# Patient Record
Sex: Female | Born: 1935 | Race: White | Hispanic: No | Smoking: Never smoker
Health system: Southern US, Community
[De-identification: ages and names within clinical notes are randomized; demographics above are authoritative.]

## PROBLEM LIST (undated history)

## (undated) ENCOUNTER — Ambulatory Visit: Admission: EM | Source: Home / Self Care

## (undated) DIAGNOSIS — I4891 Unspecified atrial fibrillation: Secondary | ICD-10-CM

## (undated) DIAGNOSIS — H269 Unspecified cataract: Secondary | ICD-10-CM

## (undated) DIAGNOSIS — I639 Cerebral infarction, unspecified: Secondary | ICD-10-CM

## (undated) DIAGNOSIS — J189 Pneumonia, unspecified organism: Secondary | ICD-10-CM

## (undated) DIAGNOSIS — K219 Gastro-esophageal reflux disease without esophagitis: Secondary | ICD-10-CM

## (undated) DIAGNOSIS — M2550 Pain in unspecified joint: Secondary | ICD-10-CM

## (undated) DIAGNOSIS — R0600 Dyspnea, unspecified: Secondary | ICD-10-CM

## (undated) DIAGNOSIS — J449 Chronic obstructive pulmonary disease, unspecified: Secondary | ICD-10-CM

## (undated) DIAGNOSIS — M1990 Unspecified osteoarthritis, unspecified site: Secondary | ICD-10-CM

## (undated) HISTORY — DX: Gastro-esophageal reflux disease without esophagitis: K21.9

## (undated) HISTORY — DX: Cerebral infarction, unspecified: I63.9

## (undated) HISTORY — PX: CATARACT EXTRACTION: SUR2

## (undated) HISTORY — DX: Unspecified atrial fibrillation: I48.91

## (undated) HISTORY — DX: Pain in unspecified joint: M25.50

## (undated) HISTORY — PX: OTHER SURGICAL HISTORY: SHX169

## (undated) HISTORY — DX: Unspecified cataract: H26.9

## (undated) HISTORY — PX: HIP SURGERY: SHX245

## (undated) HISTORY — PX: HYSTERECTOMY ABDOMINAL WITH SALPINGECTOMY: SHX6725

---

## 2009-06-02 ENCOUNTER — Ambulatory Visit: Payer: Self-pay | Admitting: Cardiology

## 2010-04-26 ENCOUNTER — Ambulatory Visit: Payer: Self-pay | Admitting: Gastroenterology

## 2010-04-26 DIAGNOSIS — R109 Unspecified abdominal pain: Secondary | ICD-10-CM | POA: Insufficient documentation

## 2010-04-26 DIAGNOSIS — K219 Gastro-esophageal reflux disease without esophagitis: Secondary | ICD-10-CM | POA: Insufficient documentation

## 2010-04-26 DIAGNOSIS — Z8711 Personal history of peptic ulcer disease: Secondary | ICD-10-CM | POA: Insufficient documentation

## 2010-04-26 DIAGNOSIS — K5289 Other specified noninfective gastroenteritis and colitis: Secondary | ICD-10-CM | POA: Insufficient documentation

## 2010-04-28 ENCOUNTER — Encounter: Payer: Self-pay | Admitting: Internal Medicine

## 2010-05-06 ENCOUNTER — Encounter: Payer: Self-pay | Admitting: Internal Medicine

## 2010-07-29 ENCOUNTER — Encounter (INDEPENDENT_AMBULATORY_CARE_PROVIDER_SITE_OTHER): Payer: Self-pay

## 2010-08-23 NOTE — Assessment & Plan Note (Signed)
Summary: COLITIS/SS   Visit Type:  New patient Primary Care Provider:  Dr. Linna Darner  Chief Complaint:  colitis.  History of Present Illness: Alisha Murphy is here as self-referral for further evaluation of colitis. She reports seven years of "colitis". Intermittent bouts. Initial episode bloody stools. She states she was told she had ulcerative colitis but never was given chronic medication. Last TCS 2/07, Dr. Linna Darner, per D/C summary she had findings of pseudomembranous colitis at that time. Hospitalized in 2003 and reported to have ischemic colitis at that time. Now the "spells" involve terrible abd cramps and solid to watery stools for several hours and goes away. In hospital three weeks ago at [facility], given Cipro/Flagyl. Stool tests done but doesn't know results. CT A/P done, was told the colon was inflammed. Prior to hospitalization had been on Relafen for two weeks. In between episodes, has small bm every time she goes to urinate. ?started after Rectocele surgery? No brbpr, melena. Stools dark. Daily heartburn if misses ranitidine. 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 No dysphagia, n/v. Weight stable.    H/O remote PUD, Dr. Linna Darner did EGD.  Labs 9/11: glucose 147, cre 0.98, h/h 13/39.1. Cdiff negative.  CT A/P without contrast 9/11: left-seded colitis, ischemia vs inflammatory vs infectious.   Current Medications (verified): 1)  Tylenol Arthritis Pain 650 Mg Cr-Tabs (Acetaminophen) .... Once Daily 2)  Metoprolol Tartrate 25 Mg Tabs (Metoprolol Tartrate) .... Once Daily 3)  Lisinopril-Hydrochlorothiazide 20-12.5 Mg Tabs (Lisinopril-Hydrochlorothiazide) .... Once Daily 4)  Alprazolam 0.5 Mg Tabs (Alprazolam) .... 1/2 At Bedtime As Needed 5)  Ranitidine Hcl 150 Mg Caps (Ranitidine Hcl) .... Once Daily  Allergies (verified): 1)  ! * Anitbiotics?  Past History:  Past Medical History: Bronchitis Hypertension Tachycardia Insomnia Colonoscopy, 2007, Dr. Wilmer Floor patient,?incomplete  Past Surgical  History: Rectocele repair, 2005 Bilateral hip replacement Hysterectomy, partial Bladder Repair  Family History: Mother, deceased, stomach cancer No FH of CRC, IBD  Social History: Widowed. One son. No tob, alcohol, drug. Rare wine.  Review of Systems General:  Denies fever, chills, sweats, anorexia, fatigue, weakness, and weight loss. Eyes:  Denies vision loss. ENT:  Denies nasal congestion, sore throat, hoarseness, and difficulty swallowing. CV:  Denies chest pains, angina, palpitations, dyspnea on exertion, and peripheral edema. Resp:  Denies dyspnea at rest, dyspnea with exercise, cough, sputum, and wheezing. GI:  See HPI. GU:  Denies urinary burning and blood in urine. MS:  Denies joint pain / LOM. Derm:  Denies rash and itching. Neuro:  Denies weakness, frequent headaches, memory loss, and confusion. Psych:  Denies depression and anxiety. Endo:  Denies unusual weight change. Heme:  Denies bruising and bleeding. Allergy:  Denies hives and rash.  Vital Signs:  Patient profile:   76 year old female Height:      62.5 inches Weight:      170 pounds BMI:     30.71 Temp:     98.6 degrees F oral Pulse rate:   88 / minute BP sitting:   128 / 80  (left arm) Cuff size:   regular  Vitals Entered By: Hendricks Limes LPN (April 26, 2010 9:01 AM)  Physical Exam  General:  Well developed, well nourished, no acute distress. Head:  Normocephalic and atraumatic. Eyes:  Conjunctivae pink, no scleral icterus.  Mouth:  Oropharyngeal mucosa moist, pink.  No lesions, erythema or exudate.    Neck:  Supple; no masses or thyromegaly. Lungs:  Clear throughout to auscultation. Heart:  Regular rate and rhythm; no murmurs,  rubs,  or bruits. Abdomen:  Bowel sounds normal.  Abdomen is soft, nontender, nondistended.  No rebound or guarding.  No hepatosplenomegaly, masses or hernias.  No abdominal bruits.  Rectal:  deferred until time of colonoscopy.   Extremities:  No clubbing, cyanosis, edema  or deformities noted. Neurologic:  Alert and  oriented x4;  grossly normal neurologically. Skin:  Intact without significant lesions or rashes. Cervical Nodes:  No significant cervical adenopathy. Psych:  Alert and cooperative. Normal mood and affect.  Impression & Recommendations:  Problem # 1:  COLITIS (ICD-558.9)  Intermittent colitis for several years. Records received indicate ischemic colitis in 2003, pseudomembranous colitis in 2007, recent hospitalization for colitis (seen on CT). Patient has frequent stools inbetween episodes of severe abd pain and diarrhea (lasts for hours at a time). Colonoscopy to be performed in near future.  Risks, alternatives, and benefits including but not limited to the risk of reaction to medication, bleeding, infection, and perforation were addressed.  Patient voiced understanding and provided verbal consent.   Orders: New Patient Level IV (04540)  Problem # 2:  GERD (ICD-530.81)  Chronic GERD and recent epigastric pain in setting of Relafen. Patient worried about PUD or gastric ca. FH gastric ca in mother. Remote PUD. Patient on H2 blocker currently. EGD to be performed in near future.  Risks, alternatives, benefits including but not limited to risk of reaction to medications, bleeding, infection, and perforation addressed.  Patient voiced understanding and verbal consent obtained.   Orders: New Patient Level IV (98119)  Appended Document: COLITIS/SS Need TCS report from Dr. Linna Darner, any available from 2003 to present and including bx result.  Appended Document: COLITIS/SS Records are on your desk  Appended Document: COLITIS/SS Attempted TCS 12/09 by Dr. Linna Darner. Limited to ascending colon due to patient discomfort. Received Versed 7mg /Demeraol 50mg . Unremarkable exam otherwise. ACBE showed extensive diverticular change of sigmoid colon.

## 2010-08-23 NOTE — Letter (Signed)
Summary: RECORDS FROM [facility]  RECORDS FROM [facility]   Imported By: Rexene Alberts 04/28/2010 15:54:52  _____________________________________________________________________  External Attachment:    Type:   Image     Comment:   External Document

## 2010-08-23 NOTE — Letter (Signed)
Summary: egd/tcs order  egd/tcs order   Imported By: Ave Filter 05/06/2010 09:57:57  _____________________________________________________________________  External Attachment:    Type:   Image     Comment:   External Document  Appended Document: egd/tcs order Pt called and stated that she did her prep on Sunday..She said she thought she was scheduled for today.So, she ate a big breakfast this morning. I told her I could speak to Dr Jena Gauss about doing an additional prep for todau since she had breakfast this morning and she said she would rather wait to have the procedure. She will call back whe she is ready to do the procedure.  Appended Document: egd/tcs order Let's put a reminder to contact her about procedures if she fails to schedule in near future.  Appended Document: egd/tcs order Reminder placed in idx Jan. 2012.

## 2010-08-25 NOTE — Letter (Signed)
Summary: Recall Colonoscopy/Endoscopy, Change to Office Visit  [facility]  [address]   [place], [place] [address]   Phone: [phone]  Fax: [phone]      July 29, 2010   [facility] [address] [place], [place]  [address] 02/14/1935   Dear Ms. Collantes,   According to our records, it is time for you to schedule a Colonoscopy/Endoscopy. However, after reviewing your medical record, we recommend an office visit.   Please call [phone] at your convenience to schedule an office visit. If you have any questions or concerns, please feel free to contact our office.   Sincerely,   Cloria Spring LPN  [facility] Ph: [phone]   Fax: [phone]

## 2015-08-03 DIAGNOSIS — C4491 Basal cell carcinoma of skin, unspecified: Secondary | ICD-10-CM

## 2015-08-03 HISTORY — DX: Basal cell carcinoma of skin, unspecified: C44.91

## 2015-09-16 DIAGNOSIS — C4441 Basal cell carcinoma of skin of scalp and neck: Secondary | ICD-10-CM | POA: Diagnosis not present

## 2015-10-04 DIAGNOSIS — I10 Essential (primary) hypertension: Secondary | ICD-10-CM | POA: Diagnosis not present

## 2015-10-04 DIAGNOSIS — Z6831 Body mass index (BMI) 31.0-31.9, adult: Secondary | ICD-10-CM | POA: Diagnosis not present

## 2015-10-04 DIAGNOSIS — J440 Chronic obstructive pulmonary disease with acute lower respiratory infection: Secondary | ICD-10-CM | POA: Diagnosis not present

## 2015-10-06 DIAGNOSIS — Z1211 Encounter for screening for malignant neoplasm of colon: Secondary | ICD-10-CM | POA: Diagnosis not present

## 2015-10-07 DIAGNOSIS — Z1231 Encounter for screening mammogram for malignant neoplasm of breast: Secondary | ICD-10-CM | POA: Diagnosis not present

## 2015-10-18 DIAGNOSIS — H524 Presbyopia: Secondary | ICD-10-CM | POA: Diagnosis not present

## 2015-11-26 DIAGNOSIS — S79912A Unspecified injury of left hip, initial encounter: Secondary | ICD-10-CM | POA: Diagnosis not present

## 2015-11-26 DIAGNOSIS — Z8249 Family history of ischemic heart disease and other diseases of the circulatory system: Secondary | ICD-10-CM | POA: Diagnosis not present

## 2015-11-26 DIAGNOSIS — S86911A Strain of unspecified muscle(s) and tendon(s) at lower leg level, right leg, initial encounter: Secondary | ICD-10-CM | POA: Diagnosis not present

## 2015-11-26 DIAGNOSIS — M1990 Unspecified osteoarthritis, unspecified site: Secondary | ICD-10-CM | POA: Diagnosis not present

## 2015-11-26 DIAGNOSIS — M25551 Pain in right hip: Secondary | ICD-10-CM | POA: Diagnosis not present

## 2015-11-26 DIAGNOSIS — S79911A Unspecified injury of right hip, initial encounter: Secondary | ICD-10-CM | POA: Diagnosis not present

## 2015-11-26 DIAGNOSIS — Z96643 Presence of artificial hip joint, bilateral: Secondary | ICD-10-CM | POA: Diagnosis not present

## 2015-11-26 DIAGNOSIS — S76011A Strain of muscle, fascia and tendon of right hip, initial encounter: Secondary | ICD-10-CM | POA: Diagnosis not present

## 2015-11-26 DIAGNOSIS — Z7982 Long term (current) use of aspirin: Secondary | ICD-10-CM | POA: Diagnosis not present

## 2015-11-26 DIAGNOSIS — Z79899 Other long term (current) drug therapy: Secondary | ICD-10-CM | POA: Diagnosis not present

## 2015-11-26 DIAGNOSIS — M25561 Pain in right knee: Secondary | ICD-10-CM | POA: Diagnosis not present

## 2015-11-26 DIAGNOSIS — X501XXA Overexertion from prolonged static or awkward postures, initial encounter: Secondary | ICD-10-CM | POA: Diagnosis not present

## 2015-11-26 DIAGNOSIS — S8991XA Unspecified injury of right lower leg, initial encounter: Secondary | ICD-10-CM | POA: Diagnosis not present

## 2015-11-26 DIAGNOSIS — I10 Essential (primary) hypertension: Secondary | ICD-10-CM | POA: Diagnosis not present

## 2015-11-28 DIAGNOSIS — J9811 Atelectasis: Secondary | ICD-10-CM | POA: Diagnosis not present

## 2015-11-28 DIAGNOSIS — D72829 Elevated white blood cell count, unspecified: Secondary | ICD-10-CM | POA: Diagnosis not present

## 2015-11-28 DIAGNOSIS — L02416 Cutaneous abscess of left lower limb: Secondary | ICD-10-CM | POA: Diagnosis not present

## 2015-11-28 DIAGNOSIS — K449 Diaphragmatic hernia without obstruction or gangrene: Secondary | ICD-10-CM | POA: Diagnosis not present

## 2015-11-28 DIAGNOSIS — B9562 Methicillin resistant Staphylococcus aureus infection as the cause of diseases classified elsewhere: Secondary | ICD-10-CM | POA: Diagnosis not present

## 2015-11-28 DIAGNOSIS — N178 Other acute kidney failure: Secondary | ICD-10-CM | POA: Diagnosis not present

## 2015-11-28 DIAGNOSIS — I517 Cardiomegaly: Secondary | ICD-10-CM | POA: Diagnosis not present

## 2015-11-28 DIAGNOSIS — J811 Chronic pulmonary edema: Secondary | ICD-10-CM | POA: Diagnosis not present

## 2015-11-28 DIAGNOSIS — N179 Acute kidney failure, unspecified: Secondary | ICD-10-CM | POA: Diagnosis not present

## 2015-11-28 DIAGNOSIS — R52 Pain, unspecified: Secondary | ICD-10-CM | POA: Diagnosis not present

## 2015-11-28 DIAGNOSIS — Z96643 Presence of artificial hip joint, bilateral: Secondary | ICD-10-CM | POA: Diagnosis not present

## 2015-11-28 DIAGNOSIS — A4102 Sepsis due to Methicillin resistant Staphylococcus aureus: Secondary | ICD-10-CM | POA: Diagnosis not present

## 2015-11-28 DIAGNOSIS — R4182 Altered mental status, unspecified: Secondary | ICD-10-CM | POA: Diagnosis not present

## 2015-11-28 DIAGNOSIS — M25551 Pain in right hip: Secondary | ICD-10-CM | POA: Diagnosis not present

## 2015-11-28 DIAGNOSIS — I358 Other nonrheumatic aortic valve disorders: Secondary | ICD-10-CM | POA: Diagnosis not present

## 2015-11-28 DIAGNOSIS — I959 Hypotension, unspecified: Secondary | ICD-10-CM | POA: Diagnosis not present

## 2015-11-28 DIAGNOSIS — G931 Anoxic brain damage, not elsewhere classified: Secondary | ICD-10-CM | POA: Diagnosis not present

## 2015-11-28 DIAGNOSIS — I4891 Unspecified atrial fibrillation: Secondary | ICD-10-CM | POA: Diagnosis not present

## 2015-11-28 DIAGNOSIS — R918 Other nonspecific abnormal finding of lung field: Secondary | ICD-10-CM | POA: Diagnosis not present

## 2015-11-28 DIAGNOSIS — E872 Acidosis: Secondary | ICD-10-CM | POA: Diagnosis not present

## 2015-11-28 DIAGNOSIS — R6520 Severe sepsis without septic shock: Secondary | ICD-10-CM | POA: Diagnosis not present

## 2015-11-28 DIAGNOSIS — L02415 Cutaneous abscess of right lower limb: Secondary | ICD-10-CM | POA: Diagnosis not present

## 2015-11-29 DIAGNOSIS — L02416 Cutaneous abscess of left lower limb: Secondary | ICD-10-CM | POA: Diagnosis not present

## 2015-11-29 DIAGNOSIS — L02415 Cutaneous abscess of right lower limb: Secondary | ICD-10-CM | POA: Diagnosis not present

## 2015-11-30 DIAGNOSIS — J811 Chronic pulmonary edema: Secondary | ICD-10-CM | POA: Diagnosis not present

## 2015-11-30 DIAGNOSIS — J9811 Atelectasis: Secondary | ICD-10-CM | POA: Diagnosis not present

## 2015-11-30 DIAGNOSIS — I358 Other nonrheumatic aortic valve disorders: Secondary | ICD-10-CM | POA: Diagnosis not present

## 2015-11-30 DIAGNOSIS — I4891 Unspecified atrial fibrillation: Secondary | ICD-10-CM | POA: Diagnosis not present

## 2015-11-30 DIAGNOSIS — I517 Cardiomegaly: Secondary | ICD-10-CM | POA: Diagnosis not present

## 2015-11-30 DIAGNOSIS — R4182 Altered mental status, unspecified: Secondary | ICD-10-CM | POA: Diagnosis not present

## 2015-12-01 DIAGNOSIS — R918 Other nonspecific abnormal finding of lung field: Secondary | ICD-10-CM | POA: Diagnosis not present

## 2015-12-02 DIAGNOSIS — I083 Combined rheumatic disorders of mitral, aortic and tricuspid valves: Secondary | ICD-10-CM | POA: Diagnosis not present

## 2015-12-02 DIAGNOSIS — R7881 Bacteremia: Secondary | ICD-10-CM | POA: Diagnosis not present

## 2015-12-02 DIAGNOSIS — A419 Sepsis, unspecified organism: Secondary | ICD-10-CM | POA: Diagnosis not present

## 2015-12-02 DIAGNOSIS — I638 Other cerebral infarction: Secondary | ICD-10-CM | POA: Diagnosis not present

## 2015-12-02 DIAGNOSIS — K458 Other specified abdominal hernia without obstruction or gangrene: Secondary | ICD-10-CM | POA: Diagnosis not present

## 2015-12-02 DIAGNOSIS — L02415 Cutaneous abscess of right lower limb: Secondary | ICD-10-CM | POA: Diagnosis not present

## 2015-12-02 DIAGNOSIS — R918 Other nonspecific abnormal finding of lung field: Secondary | ICD-10-CM | POA: Diagnosis not present

## 2015-12-02 DIAGNOSIS — J9601 Acute respiratory failure with hypoxia: Secondary | ICD-10-CM | POA: Diagnosis not present

## 2015-12-02 DIAGNOSIS — I081 Rheumatic disorders of both mitral and tricuspid valves: Secondary | ICD-10-CM | POA: Diagnosis not present

## 2015-12-02 DIAGNOSIS — R2689 Other abnormalities of gait and mobility: Secondary | ICD-10-CM | POA: Diagnosis not present

## 2015-12-02 DIAGNOSIS — I82A11 Acute embolism and thrombosis of right axillary vein: Secondary | ICD-10-CM | POA: Diagnosis not present

## 2015-12-02 DIAGNOSIS — R930 Abnormal findings on diagnostic imaging of skull and head, not elsewhere classified: Secondary | ICD-10-CM | POA: Diagnosis not present

## 2015-12-02 DIAGNOSIS — D649 Anemia, unspecified: Secondary | ICD-10-CM | POA: Diagnosis not present

## 2015-12-02 DIAGNOSIS — I10 Essential (primary) hypertension: Secondary | ICD-10-CM | POA: Diagnosis not present

## 2015-12-02 DIAGNOSIS — A4102 Sepsis due to Methicillin resistant Staphylococcus aureus: Secondary | ICD-10-CM | POA: Diagnosis not present

## 2015-12-02 DIAGNOSIS — A4151 Sepsis due to Escherichia coli [E. coli]: Secondary | ICD-10-CM | POA: Diagnosis not present

## 2015-12-02 DIAGNOSIS — I480 Paroxysmal atrial fibrillation: Secondary | ICD-10-CM | POA: Diagnosis not present

## 2015-12-02 DIAGNOSIS — I517 Cardiomegaly: Secondary | ICD-10-CM | POA: Diagnosis not present

## 2015-12-02 DIAGNOSIS — M7071 Other bursitis of hip, right hip: Secondary | ICD-10-CM | POA: Diagnosis not present

## 2015-12-02 DIAGNOSIS — B9562 Methicillin resistant Staphylococcus aureus infection as the cause of diseases classified elsewhere: Secondary | ICD-10-CM | POA: Diagnosis not present

## 2015-12-02 DIAGNOSIS — R278 Other lack of coordination: Secondary | ICD-10-CM | POA: Diagnosis not present

## 2015-12-02 DIAGNOSIS — I639 Cerebral infarction, unspecified: Secondary | ICD-10-CM | POA: Diagnosis not present

## 2015-12-02 DIAGNOSIS — E785 Hyperlipidemia, unspecified: Secondary | ICD-10-CM | POA: Diagnosis not present

## 2015-12-02 DIAGNOSIS — M6281 Muscle weakness (generalized): Secondary | ICD-10-CM | POA: Diagnosis not present

## 2015-12-02 DIAGNOSIS — I6310 Cerebral infarction due to embolism of unspecified precerebral artery: Secondary | ICD-10-CM | POA: Diagnosis not present

## 2015-12-02 DIAGNOSIS — Z4682 Encounter for fitting and adjustment of non-vascular catheter: Secondary | ICD-10-CM | POA: Diagnosis not present

## 2015-12-02 DIAGNOSIS — I82B11 Acute embolism and thrombosis of right subclavian vein: Secondary | ICD-10-CM | POA: Diagnosis not present

## 2015-12-02 DIAGNOSIS — F05 Delirium due to known physiological condition: Secondary | ICD-10-CM | POA: Diagnosis not present

## 2015-12-02 DIAGNOSIS — Z471 Aftercare following joint replacement surgery: Secondary | ICD-10-CM | POA: Diagnosis not present

## 2015-12-02 DIAGNOSIS — Z5189 Encounter for other specified aftercare: Secondary | ICD-10-CM | POA: Diagnosis not present

## 2015-12-02 DIAGNOSIS — Z96641 Presence of right artificial hip joint: Secondary | ICD-10-CM | POA: Diagnosis not present

## 2015-12-02 DIAGNOSIS — R41841 Cognitive communication deficit: Secondary | ICD-10-CM | POA: Diagnosis not present

## 2015-12-02 DIAGNOSIS — M7061 Trochanteric bursitis, right hip: Secondary | ICD-10-CM | POA: Diagnosis not present

## 2015-12-02 DIAGNOSIS — Z7401 Bed confinement status: Secondary | ICD-10-CM | POA: Diagnosis not present

## 2015-12-02 DIAGNOSIS — I6349 Cerebral infarction due to embolism of other cerebral artery: Secondary | ICD-10-CM | POA: Diagnosis not present

## 2015-12-02 DIAGNOSIS — Z96643 Presence of artificial hip joint, bilateral: Secondary | ICD-10-CM | POA: Diagnosis not present

## 2015-12-02 DIAGNOSIS — L0291 Cutaneous abscess, unspecified: Secondary | ICD-10-CM | POA: Diagnosis not present

## 2015-12-02 DIAGNOSIS — R569 Unspecified convulsions: Secondary | ICD-10-CM | POA: Diagnosis not present

## 2015-12-02 DIAGNOSIS — R279 Unspecified lack of coordination: Secondary | ICD-10-CM | POA: Diagnosis not present

## 2015-12-02 DIAGNOSIS — G9340 Encephalopathy, unspecified: Secondary | ICD-10-CM | POA: Diagnosis not present

## 2015-12-02 DIAGNOSIS — E784 Other hyperlipidemia: Secondary | ICD-10-CM | POA: Diagnosis not present

## 2015-12-02 DIAGNOSIS — M7108 Abscess of bursa, other site: Secondary | ICD-10-CM | POA: Diagnosis not present

## 2015-12-02 DIAGNOSIS — E872 Acidosis: Secondary | ICD-10-CM | POA: Diagnosis not present

## 2015-12-02 DIAGNOSIS — S7001XA Contusion of right hip, initial encounter: Secondary | ICD-10-CM | POA: Diagnosis not present

## 2015-12-02 DIAGNOSIS — N170 Acute kidney failure with tubular necrosis: Secondary | ICD-10-CM | POA: Diagnosis not present

## 2015-12-02 DIAGNOSIS — I6319 Cerebral infarction due to embolism of other precerebral artery: Secondary | ICD-10-CM | POA: Diagnosis not present

## 2015-12-02 DIAGNOSIS — N179 Acute kidney failure, unspecified: Secondary | ICD-10-CM | POA: Diagnosis not present

## 2015-12-02 DIAGNOSIS — N178 Other acute kidney failure: Secondary | ICD-10-CM | POA: Diagnosis not present

## 2015-12-02 DIAGNOSIS — R6520 Severe sepsis without septic shock: Secondary | ICD-10-CM | POA: Diagnosis not present

## 2015-12-02 DIAGNOSIS — R4182 Altered mental status, unspecified: Secondary | ICD-10-CM | POA: Diagnosis not present

## 2015-12-02 DIAGNOSIS — M00051 Staphylococcal arthritis, right hip: Secondary | ICD-10-CM | POA: Diagnosis not present

## 2015-12-03 DIAGNOSIS — R569 Unspecified convulsions: Secondary | ICD-10-CM | POA: Diagnosis not present

## 2015-12-03 DIAGNOSIS — I517 Cardiomegaly: Secondary | ICD-10-CM | POA: Diagnosis not present

## 2015-12-03 DIAGNOSIS — N179 Acute kidney failure, unspecified: Secondary | ICD-10-CM | POA: Diagnosis not present

## 2015-12-03 DIAGNOSIS — J9601 Acute respiratory failure with hypoxia: Secondary | ICD-10-CM | POA: Diagnosis not present

## 2015-12-03 DIAGNOSIS — G9340 Encephalopathy, unspecified: Secondary | ICD-10-CM | POA: Diagnosis not present

## 2015-12-03 DIAGNOSIS — A419 Sepsis, unspecified organism: Secondary | ICD-10-CM | POA: Diagnosis not present

## 2015-12-03 DIAGNOSIS — I083 Combined rheumatic disorders of mitral, aortic and tricuspid valves: Secondary | ICD-10-CM | POA: Diagnosis not present

## 2015-12-04 DIAGNOSIS — G9340 Encephalopathy, unspecified: Secondary | ICD-10-CM | POA: Diagnosis not present

## 2015-12-04 DIAGNOSIS — N179 Acute kidney failure, unspecified: Secondary | ICD-10-CM | POA: Diagnosis not present

## 2015-12-04 DIAGNOSIS — I639 Cerebral infarction, unspecified: Secondary | ICD-10-CM | POA: Diagnosis not present

## 2015-12-04 DIAGNOSIS — R569 Unspecified convulsions: Secondary | ICD-10-CM | POA: Diagnosis not present

## 2015-12-04 DIAGNOSIS — A419 Sepsis, unspecified organism: Secondary | ICD-10-CM | POA: Diagnosis not present

## 2015-12-05 DIAGNOSIS — G9340 Encephalopathy, unspecified: Secondary | ICD-10-CM | POA: Diagnosis not present

## 2015-12-05 DIAGNOSIS — N179 Acute kidney failure, unspecified: Secondary | ICD-10-CM | POA: Diagnosis not present

## 2015-12-05 DIAGNOSIS — I6319 Cerebral infarction due to embolism of other precerebral artery: Secondary | ICD-10-CM | POA: Diagnosis not present

## 2015-12-05 DIAGNOSIS — A419 Sepsis, unspecified organism: Secondary | ICD-10-CM | POA: Diagnosis not present

## 2015-12-05 DIAGNOSIS — I639 Cerebral infarction, unspecified: Secondary | ICD-10-CM | POA: Diagnosis not present

## 2015-12-05 DIAGNOSIS — I638 Other cerebral infarction: Secondary | ICD-10-CM | POA: Diagnosis not present

## 2015-12-05 DIAGNOSIS — F05 Delirium due to known physiological condition: Secondary | ICD-10-CM | POA: Diagnosis not present

## 2015-12-06 DIAGNOSIS — I480 Paroxysmal atrial fibrillation: Secondary | ICD-10-CM | POA: Diagnosis not present

## 2015-12-06 DIAGNOSIS — A4102 Sepsis due to Methicillin resistant Staphylococcus aureus: Secondary | ICD-10-CM | POA: Diagnosis not present

## 2015-12-06 DIAGNOSIS — N179 Acute kidney failure, unspecified: Secondary | ICD-10-CM | POA: Diagnosis not present

## 2015-12-06 DIAGNOSIS — G9340 Encephalopathy, unspecified: Secondary | ICD-10-CM | POA: Diagnosis not present

## 2015-12-06 DIAGNOSIS — A419 Sepsis, unspecified organism: Secondary | ICD-10-CM | POA: Diagnosis not present

## 2015-12-06 DIAGNOSIS — I6319 Cerebral infarction due to embolism of other precerebral artery: Secondary | ICD-10-CM | POA: Diagnosis not present

## 2015-12-06 DIAGNOSIS — I10 Essential (primary) hypertension: Secondary | ICD-10-CM | POA: Diagnosis not present

## 2015-12-06 DIAGNOSIS — J9601 Acute respiratory failure with hypoxia: Secondary | ICD-10-CM | POA: Diagnosis not present

## 2015-12-07 DIAGNOSIS — L02415 Cutaneous abscess of right lower limb: Secondary | ICD-10-CM | POA: Diagnosis not present

## 2015-12-07 DIAGNOSIS — S7001XA Contusion of right hip, initial encounter: Secondary | ICD-10-CM | POA: Diagnosis not present

## 2015-12-07 DIAGNOSIS — M7071 Other bursitis of hip, right hip: Secondary | ICD-10-CM | POA: Diagnosis not present

## 2015-12-08 DIAGNOSIS — J9601 Acute respiratory failure with hypoxia: Secondary | ICD-10-CM | POA: Diagnosis not present

## 2015-12-08 DIAGNOSIS — L02415 Cutaneous abscess of right lower limb: Secondary | ICD-10-CM | POA: Diagnosis not present

## 2015-12-08 DIAGNOSIS — Z96641 Presence of right artificial hip joint: Secondary | ICD-10-CM | POA: Diagnosis not present

## 2015-12-08 DIAGNOSIS — E872 Acidosis: Secondary | ICD-10-CM | POA: Diagnosis not present

## 2015-12-08 DIAGNOSIS — A419 Sepsis, unspecified organism: Secondary | ICD-10-CM | POA: Diagnosis not present

## 2015-12-09 DIAGNOSIS — I639 Cerebral infarction, unspecified: Secondary | ICD-10-CM | POA: Diagnosis not present

## 2015-12-09 DIAGNOSIS — Z96643 Presence of artificial hip joint, bilateral: Secondary | ICD-10-CM | POA: Diagnosis not present

## 2015-12-09 DIAGNOSIS — I081 Rheumatic disorders of both mitral and tricuspid valves: Secondary | ICD-10-CM | POA: Diagnosis not present

## 2015-12-09 DIAGNOSIS — I517 Cardiomegaly: Secondary | ICD-10-CM | POA: Diagnosis not present

## 2015-12-09 DIAGNOSIS — Z471 Aftercare following joint replacement surgery: Secondary | ICD-10-CM | POA: Diagnosis not present

## 2015-12-10 DIAGNOSIS — M7061 Trochanteric bursitis, right hip: Secondary | ICD-10-CM | POA: Diagnosis not present

## 2015-12-10 DIAGNOSIS — N179 Acute kidney failure, unspecified: Secondary | ICD-10-CM | POA: Diagnosis not present

## 2015-12-10 DIAGNOSIS — I6310 Cerebral infarction due to embolism of unspecified precerebral artery: Secondary | ICD-10-CM | POA: Diagnosis not present

## 2015-12-10 DIAGNOSIS — I480 Paroxysmal atrial fibrillation: Secondary | ICD-10-CM | POA: Diagnosis not present

## 2015-12-11 DIAGNOSIS — A419 Sepsis, unspecified organism: Secondary | ICD-10-CM | POA: Diagnosis not present

## 2015-12-12 DIAGNOSIS — A419 Sepsis, unspecified organism: Secondary | ICD-10-CM | POA: Diagnosis not present

## 2015-12-12 DIAGNOSIS — I6319 Cerebral infarction due to embolism of other precerebral artery: Secondary | ICD-10-CM | POA: Diagnosis not present

## 2015-12-12 DIAGNOSIS — A4102 Sepsis due to Methicillin resistant Staphylococcus aureus: Secondary | ICD-10-CM | POA: Diagnosis not present

## 2015-12-12 DIAGNOSIS — I480 Paroxysmal atrial fibrillation: Secondary | ICD-10-CM | POA: Diagnosis not present

## 2015-12-12 DIAGNOSIS — I10 Essential (primary) hypertension: Secondary | ICD-10-CM | POA: Diagnosis not present

## 2015-12-13 DIAGNOSIS — A419 Sepsis, unspecified organism: Secondary | ICD-10-CM | POA: Diagnosis not present

## 2015-12-13 DIAGNOSIS — A4102 Sepsis due to Methicillin resistant Staphylococcus aureus: Secondary | ICD-10-CM | POA: Diagnosis not present

## 2015-12-14 DIAGNOSIS — L0291 Cutaneous abscess, unspecified: Secondary | ICD-10-CM | POA: Diagnosis not present

## 2015-12-14 DIAGNOSIS — I6349 Cerebral infarction due to embolism of other cerebral artery: Secondary | ICD-10-CM | POA: Diagnosis not present

## 2015-12-14 DIAGNOSIS — I82B11 Acute embolism and thrombosis of right subclavian vein: Secondary | ICD-10-CM | POA: Diagnosis not present

## 2015-12-14 DIAGNOSIS — I82A11 Acute embolism and thrombosis of right axillary vein: Secondary | ICD-10-CM | POA: Diagnosis not present

## 2015-12-14 DIAGNOSIS — M00051 Staphylococcal arthritis, right hip: Secondary | ICD-10-CM | POA: Diagnosis not present

## 2015-12-14 DIAGNOSIS — I480 Paroxysmal atrial fibrillation: Secondary | ICD-10-CM | POA: Diagnosis not present

## 2015-12-14 DIAGNOSIS — A4151 Sepsis due to Escherichia coli [E. coli]: Secondary | ICD-10-CM | POA: Diagnosis not present

## 2015-12-16 DIAGNOSIS — M00051 Staphylococcal arthritis, right hip: Secondary | ICD-10-CM | POA: Diagnosis not present

## 2015-12-16 DIAGNOSIS — E784 Other hyperlipidemia: Secondary | ICD-10-CM | POA: Diagnosis not present

## 2015-12-16 DIAGNOSIS — K458 Other specified abdominal hernia without obstruction or gangrene: Secondary | ICD-10-CM | POA: Diagnosis not present

## 2015-12-16 DIAGNOSIS — T8451XA Infection and inflammatory reaction due to internal right hip prosthesis, initial encounter: Secondary | ICD-10-CM | POA: Diagnosis not present

## 2015-12-16 DIAGNOSIS — Z96641 Presence of right artificial hip joint: Secondary | ICD-10-CM | POA: Diagnosis not present

## 2015-12-16 DIAGNOSIS — L02415 Cutaneous abscess of right lower limb: Secondary | ICD-10-CM | POA: Diagnosis not present

## 2015-12-16 DIAGNOSIS — Z7401 Bed confinement status: Secondary | ICD-10-CM | POA: Diagnosis not present

## 2015-12-16 DIAGNOSIS — Z96649 Presence of unspecified artificial hip joint: Secondary | ICD-10-CM | POA: Diagnosis not present

## 2015-12-16 DIAGNOSIS — Z9689 Presence of other specified functional implants: Secondary | ICD-10-CM | POA: Diagnosis not present

## 2015-12-16 DIAGNOSIS — A4102 Sepsis due to Methicillin resistant Staphylococcus aureus: Secondary | ICD-10-CM | POA: Diagnosis not present

## 2015-12-16 DIAGNOSIS — I82A11 Acute embolism and thrombosis of right axillary vein: Secondary | ICD-10-CM | POA: Diagnosis not present

## 2015-12-16 DIAGNOSIS — Z743 Need for continuous supervision: Secondary | ICD-10-CM | POA: Diagnosis not present

## 2015-12-16 DIAGNOSIS — Z9981 Dependence on supplemental oxygen: Secondary | ICD-10-CM | POA: Diagnosis not present

## 2015-12-16 DIAGNOSIS — R109 Unspecified abdominal pain: Secondary | ICD-10-CM | POA: Diagnosis not present

## 2015-12-16 DIAGNOSIS — T8459XA Infection and inflammatory reaction due to other internal joint prosthesis, initial encounter: Secondary | ICD-10-CM | POA: Diagnosis not present

## 2015-12-16 DIAGNOSIS — I6349 Cerebral infarction due to embolism of other cerebral artery: Secondary | ICD-10-CM | POA: Diagnosis not present

## 2015-12-16 DIAGNOSIS — R278 Other lack of coordination: Secondary | ICD-10-CM | POA: Diagnosis not present

## 2015-12-16 DIAGNOSIS — R531 Weakness: Secondary | ICD-10-CM | POA: Diagnosis not present

## 2015-12-16 DIAGNOSIS — R2689 Other abnormalities of gait and mobility: Secondary | ICD-10-CM | POA: Diagnosis not present

## 2015-12-16 DIAGNOSIS — M7108 Abscess of bursa, other site: Secondary | ICD-10-CM | POA: Diagnosis not present

## 2015-12-16 DIAGNOSIS — I639 Cerebral infarction, unspecified: Secondary | ICD-10-CM | POA: Diagnosis not present

## 2015-12-16 DIAGNOSIS — I10 Essential (primary) hypertension: Secondary | ICD-10-CM | POA: Diagnosis not present

## 2015-12-16 DIAGNOSIS — R41841 Cognitive communication deficit: Secondary | ICD-10-CM | POA: Diagnosis not present

## 2015-12-16 DIAGNOSIS — D649 Anemia, unspecified: Secondary | ICD-10-CM | POA: Diagnosis not present

## 2015-12-16 DIAGNOSIS — E785 Hyperlipidemia, unspecified: Secondary | ICD-10-CM | POA: Diagnosis not present

## 2015-12-16 DIAGNOSIS — Z5189 Encounter for other specified aftercare: Secondary | ICD-10-CM | POA: Diagnosis not present

## 2015-12-16 DIAGNOSIS — M6281 Muscle weakness (generalized): Secondary | ICD-10-CM | POA: Diagnosis not present

## 2015-12-16 DIAGNOSIS — N178 Other acute kidney failure: Secondary | ICD-10-CM | POA: Diagnosis not present

## 2015-12-16 DIAGNOSIS — I480 Paroxysmal atrial fibrillation: Secondary | ICD-10-CM | POA: Diagnosis not present

## 2015-12-16 DIAGNOSIS — L0291 Cutaneous abscess, unspecified: Secondary | ICD-10-CM | POA: Diagnosis not present

## 2015-12-16 DIAGNOSIS — R7881 Bacteremia: Secondary | ICD-10-CM | POA: Diagnosis not present

## 2015-12-16 DIAGNOSIS — I4891 Unspecified atrial fibrillation: Secondary | ICD-10-CM | POA: Diagnosis not present

## 2015-12-16 DIAGNOSIS — R569 Unspecified convulsions: Secondary | ICD-10-CM | POA: Diagnosis not present

## 2015-12-27 DIAGNOSIS — R109 Unspecified abdominal pain: Secondary | ICD-10-CM | POA: Diagnosis not present

## 2015-12-28 DIAGNOSIS — Z743 Need for continuous supervision: Secondary | ICD-10-CM | POA: Diagnosis not present

## 2015-12-28 DIAGNOSIS — Z9981 Dependence on supplemental oxygen: Secondary | ICD-10-CM | POA: Diagnosis not present

## 2015-12-28 DIAGNOSIS — R531 Weakness: Secondary | ICD-10-CM | POA: Diagnosis not present

## 2015-12-28 DIAGNOSIS — D649 Anemia, unspecified: Secondary | ICD-10-CM | POA: Diagnosis not present

## 2015-12-31 DIAGNOSIS — T8459XA Infection and inflammatory reaction due to other internal joint prosthesis, initial encounter: Secondary | ICD-10-CM | POA: Diagnosis not present

## 2015-12-31 DIAGNOSIS — Z96649 Presence of unspecified artificial hip joint: Secondary | ICD-10-CM | POA: Diagnosis not present

## 2015-12-31 DIAGNOSIS — E785 Hyperlipidemia, unspecified: Secondary | ICD-10-CM | POA: Diagnosis not present

## 2015-12-31 DIAGNOSIS — T8451XA Infection and inflammatory reaction due to internal right hip prosthesis, initial encounter: Secondary | ICD-10-CM | POA: Diagnosis not present

## 2015-12-31 DIAGNOSIS — R7881 Bacteremia: Secondary | ICD-10-CM | POA: Diagnosis not present

## 2015-12-31 DIAGNOSIS — I10 Essential (primary) hypertension: Secondary | ICD-10-CM | POA: Diagnosis not present

## 2015-12-31 DIAGNOSIS — I4891 Unspecified atrial fibrillation: Secondary | ICD-10-CM | POA: Diagnosis not present

## 2015-12-31 DIAGNOSIS — I639 Cerebral infarction, unspecified: Secondary | ICD-10-CM | POA: Diagnosis not present

## 2016-01-10 DIAGNOSIS — L02415 Cutaneous abscess of right lower limb: Secondary | ICD-10-CM | POA: Diagnosis not present

## 2016-01-10 DIAGNOSIS — Z96641 Presence of right artificial hip joint: Secondary | ICD-10-CM | POA: Diagnosis not present

## 2016-01-10 DIAGNOSIS — Z9689 Presence of other specified functional implants: Secondary | ICD-10-CM | POA: Diagnosis not present

## 2016-01-19 DIAGNOSIS — L02415 Cutaneous abscess of right lower limb: Secondary | ICD-10-CM | POA: Diagnosis not present

## 2016-01-23 DIAGNOSIS — E785 Hyperlipidemia, unspecified: Secondary | ICD-10-CM | POA: Diagnosis not present

## 2016-01-23 DIAGNOSIS — I2510 Atherosclerotic heart disease of native coronary artery without angina pectoris: Secondary | ICD-10-CM | POA: Diagnosis not present

## 2016-01-23 DIAGNOSIS — G629 Polyneuropathy, unspecified: Secondary | ICD-10-CM | POA: Diagnosis not present

## 2016-01-23 DIAGNOSIS — A419 Sepsis, unspecified organism: Secondary | ICD-10-CM | POA: Diagnosis not present

## 2016-01-23 DIAGNOSIS — M6281 Muscle weakness (generalized): Secondary | ICD-10-CM | POA: Diagnosis not present

## 2016-01-23 DIAGNOSIS — I480 Paroxysmal atrial fibrillation: Secondary | ICD-10-CM | POA: Diagnosis not present

## 2016-01-23 DIAGNOSIS — D649 Anemia, unspecified: Secondary | ICD-10-CM | POA: Diagnosis not present

## 2016-01-23 DIAGNOSIS — F419 Anxiety disorder, unspecified: Secondary | ICD-10-CM | POA: Diagnosis not present

## 2016-01-23 DIAGNOSIS — J449 Chronic obstructive pulmonary disease, unspecified: Secondary | ICD-10-CM | POA: Diagnosis not present

## 2016-01-23 DIAGNOSIS — L02415 Cutaneous abscess of right lower limb: Secondary | ICD-10-CM | POA: Diagnosis not present

## 2016-01-28 DIAGNOSIS — I481 Persistent atrial fibrillation: Secondary | ICD-10-CM | POA: Diagnosis not present

## 2016-01-28 DIAGNOSIS — J440 Chronic obstructive pulmonary disease with acute lower respiratory infection: Secondary | ICD-10-CM | POA: Diagnosis not present

## 2016-01-28 DIAGNOSIS — Z6831 Body mass index (BMI) 31.0-31.9, adult: Secondary | ICD-10-CM | POA: Diagnosis not present

## 2016-01-28 DIAGNOSIS — I6789 Other cerebrovascular disease: Secondary | ICD-10-CM | POA: Diagnosis not present

## 2016-01-28 DIAGNOSIS — I10 Essential (primary) hypertension: Secondary | ICD-10-CM | POA: Diagnosis not present

## 2016-01-28 DIAGNOSIS — M25551 Pain in right hip: Secondary | ICD-10-CM | POA: Diagnosis not present

## 2016-02-03 DIAGNOSIS — I10 Essential (primary) hypertension: Secondary | ICD-10-CM | POA: Diagnosis not present

## 2016-02-03 DIAGNOSIS — I6789 Other cerebrovascular disease: Secondary | ICD-10-CM | POA: Diagnosis not present

## 2016-02-04 DIAGNOSIS — R7881 Bacteremia: Secondary | ICD-10-CM | POA: Diagnosis not present

## 2016-02-04 DIAGNOSIS — L02415 Cutaneous abscess of right lower limb: Secondary | ICD-10-CM | POA: Diagnosis not present

## 2016-02-09 DIAGNOSIS — I10 Essential (primary) hypertension: Secondary | ICD-10-CM | POA: Diagnosis not present

## 2016-02-09 DIAGNOSIS — I6789 Other cerebrovascular disease: Secondary | ICD-10-CM | POA: Diagnosis not present

## 2016-02-09 DIAGNOSIS — Z79899 Other long term (current) drug therapy: Secondary | ICD-10-CM | POA: Diagnosis not present

## 2016-02-28 DIAGNOSIS — S52512A Displaced fracture of left radial styloid process, initial encounter for closed fracture: Secondary | ICD-10-CM | POA: Diagnosis not present

## 2016-02-29 DIAGNOSIS — J440 Chronic obstructive pulmonary disease with acute lower respiratory infection: Secondary | ICD-10-CM | POA: Diagnosis not present

## 2016-02-29 DIAGNOSIS — I6789 Other cerebrovascular disease: Secondary | ICD-10-CM | POA: Diagnosis not present

## 2016-02-29 DIAGNOSIS — Z6829 Body mass index (BMI) 29.0-29.9, adult: Secondary | ICD-10-CM | POA: Diagnosis not present

## 2016-02-29 DIAGNOSIS — M25551 Pain in right hip: Secondary | ICD-10-CM | POA: Diagnosis not present

## 2016-03-13 DIAGNOSIS — L570 Actinic keratosis: Secondary | ICD-10-CM | POA: Diagnosis not present

## 2016-03-13 DIAGNOSIS — C4441 Basal cell carcinoma of skin of scalp and neck: Secondary | ICD-10-CM | POA: Diagnosis not present

## 2016-04-03 DIAGNOSIS — M6281 Muscle weakness (generalized): Secondary | ICD-10-CM | POA: Diagnosis not present

## 2016-04-03 DIAGNOSIS — R2689 Other abnormalities of gait and mobility: Secondary | ICD-10-CM | POA: Diagnosis not present

## 2016-04-06 DIAGNOSIS — J449 Chronic obstructive pulmonary disease, unspecified: Secondary | ICD-10-CM | POA: Diagnosis not present

## 2016-04-06 DIAGNOSIS — F419 Anxiety disorder, unspecified: Secondary | ICD-10-CM | POA: Diagnosis not present

## 2016-04-06 DIAGNOSIS — Z96643 Presence of artificial hip joint, bilateral: Secondary | ICD-10-CM | POA: Diagnosis not present

## 2016-04-06 DIAGNOSIS — M1990 Unspecified osteoarthritis, unspecified site: Secondary | ICD-10-CM | POA: Diagnosis not present

## 2016-04-06 DIAGNOSIS — I10 Essential (primary) hypertension: Secondary | ICD-10-CM | POA: Diagnosis not present

## 2016-04-06 DIAGNOSIS — Z7982 Long term (current) use of aspirin: Secondary | ICD-10-CM | POA: Diagnosis not present

## 2016-04-06 DIAGNOSIS — M810 Age-related osteoporosis without current pathological fracture: Secondary | ICD-10-CM | POA: Diagnosis not present

## 2016-04-06 DIAGNOSIS — Z79899 Other long term (current) drug therapy: Secondary | ICD-10-CM | POA: Diagnosis not present

## 2016-04-06 DIAGNOSIS — Z780 Asymptomatic menopausal state: Secondary | ICD-10-CM | POA: Diagnosis not present

## 2016-04-10 DIAGNOSIS — M6281 Muscle weakness (generalized): Secondary | ICD-10-CM | POA: Diagnosis not present

## 2016-04-10 DIAGNOSIS — R2689 Other abnormalities of gait and mobility: Secondary | ICD-10-CM | POA: Diagnosis not present

## 2016-04-12 DIAGNOSIS — R2689 Other abnormalities of gait and mobility: Secondary | ICD-10-CM | POA: Diagnosis not present

## 2016-04-12 DIAGNOSIS — M6281 Muscle weakness (generalized): Secondary | ICD-10-CM | POA: Diagnosis not present

## 2016-04-17 DIAGNOSIS — M6281 Muscle weakness (generalized): Secondary | ICD-10-CM | POA: Diagnosis not present

## 2016-04-17 DIAGNOSIS — R2689 Other abnormalities of gait and mobility: Secondary | ICD-10-CM | POA: Diagnosis not present

## 2016-04-19 DIAGNOSIS — R2689 Other abnormalities of gait and mobility: Secondary | ICD-10-CM | POA: Diagnosis not present

## 2016-04-19 DIAGNOSIS — M6281 Muscle weakness (generalized): Secondary | ICD-10-CM | POA: Diagnosis not present

## 2016-04-24 DIAGNOSIS — M6281 Muscle weakness (generalized): Secondary | ICD-10-CM | POA: Diagnosis not present

## 2016-04-24 DIAGNOSIS — R2689 Other abnormalities of gait and mobility: Secondary | ICD-10-CM | POA: Diagnosis not present

## 2016-04-26 DIAGNOSIS — R2689 Other abnormalities of gait and mobility: Secondary | ICD-10-CM | POA: Diagnosis not present

## 2016-04-26 DIAGNOSIS — M6281 Muscle weakness (generalized): Secondary | ICD-10-CM | POA: Diagnosis not present

## 2016-05-01 DIAGNOSIS — R2689 Other abnormalities of gait and mobility: Secondary | ICD-10-CM | POA: Diagnosis not present

## 2016-05-01 DIAGNOSIS — M6281 Muscle weakness (generalized): Secondary | ICD-10-CM | POA: Diagnosis not present

## 2016-05-02 DIAGNOSIS — N6459 Other signs and symptoms in breast: Secondary | ICD-10-CM | POA: Diagnosis not present

## 2016-05-02 DIAGNOSIS — Z6828 Body mass index (BMI) 28.0-28.9, adult: Secondary | ICD-10-CM | POA: Diagnosis not present

## 2016-05-03 DIAGNOSIS — R2689 Other abnormalities of gait and mobility: Secondary | ICD-10-CM | POA: Diagnosis not present

## 2016-05-03 DIAGNOSIS — M6281 Muscle weakness (generalized): Secondary | ICD-10-CM | POA: Diagnosis not present

## 2016-05-10 DIAGNOSIS — R928 Other abnormal and inconclusive findings on diagnostic imaging of breast: Secondary | ICD-10-CM | POA: Diagnosis not present

## 2016-05-10 DIAGNOSIS — N6459 Other signs and symptoms in breast: Secondary | ICD-10-CM | POA: Diagnosis not present

## 2016-07-04 DIAGNOSIS — I481 Persistent atrial fibrillation: Secondary | ICD-10-CM | POA: Diagnosis not present

## 2016-07-04 DIAGNOSIS — Z6830 Body mass index (BMI) 30.0-30.9, adult: Secondary | ICD-10-CM | POA: Diagnosis not present

## 2016-07-04 DIAGNOSIS — I10 Essential (primary) hypertension: Secondary | ICD-10-CM | POA: Diagnosis not present

## 2016-10-03 DIAGNOSIS — H81393 Other peripheral vertigo, bilateral: Secondary | ICD-10-CM | POA: Diagnosis not present

## 2016-10-03 DIAGNOSIS — Z6831 Body mass index (BMI) 31.0-31.9, adult: Secondary | ICD-10-CM | POA: Diagnosis not present

## 2016-10-03 DIAGNOSIS — I481 Persistent atrial fibrillation: Secondary | ICD-10-CM | POA: Diagnosis not present

## 2016-10-03 DIAGNOSIS — I10 Essential (primary) hypertension: Secondary | ICD-10-CM | POA: Diagnosis not present

## 2016-10-09 DIAGNOSIS — M25551 Pain in right hip: Secondary | ICD-10-CM | POA: Diagnosis not present

## 2016-10-09 DIAGNOSIS — Z1231 Encounter for screening mammogram for malignant neoplasm of breast: Secondary | ICD-10-CM | POA: Diagnosis not present

## 2016-10-09 DIAGNOSIS — M25552 Pain in left hip: Secondary | ICD-10-CM | POA: Diagnosis not present

## 2016-11-13 DIAGNOSIS — M1711 Unilateral primary osteoarthritis, right knee: Secondary | ICD-10-CM | POA: Diagnosis not present

## 2016-11-13 DIAGNOSIS — Z6831 Body mass index (BMI) 31.0-31.9, adult: Secondary | ICD-10-CM | POA: Diagnosis not present

## 2016-11-21 DIAGNOSIS — M1711 Unilateral primary osteoarthritis, right knee: Secondary | ICD-10-CM | POA: Diagnosis not present

## 2016-11-21 DIAGNOSIS — Z6831 Body mass index (BMI) 31.0-31.9, adult: Secondary | ICD-10-CM | POA: Diagnosis not present

## 2016-11-28 DIAGNOSIS — M79604 Pain in right leg: Secondary | ICD-10-CM | POA: Diagnosis not present

## 2016-12-14 DIAGNOSIS — M5126 Other intervertebral disc displacement, lumbar region: Secondary | ICD-10-CM | POA: Diagnosis not present

## 2017-01-08 DIAGNOSIS — I10 Essential (primary) hypertension: Secondary | ICD-10-CM | POA: Diagnosis not present

## 2017-01-08 DIAGNOSIS — J4521 Mild intermittent asthma with (acute) exacerbation: Secondary | ICD-10-CM | POA: Diagnosis not present

## 2017-01-08 DIAGNOSIS — M5431 Sciatica, right side: Secondary | ICD-10-CM | POA: Diagnosis not present

## 2017-01-18 DIAGNOSIS — R202 Paresthesia of skin: Secondary | ICD-10-CM | POA: Diagnosis not present

## 2017-01-18 DIAGNOSIS — R937 Abnormal findings on diagnostic imaging of other parts of musculoskeletal system: Secondary | ICD-10-CM | POA: Diagnosis not present

## 2017-03-01 DIAGNOSIS — M79604 Pain in right leg: Secondary | ICD-10-CM | POA: Diagnosis not present

## 2017-03-01 DIAGNOSIS — R202 Paresthesia of skin: Secondary | ICD-10-CM | POA: Diagnosis not present

## 2017-03-01 DIAGNOSIS — R937 Abnormal findings on diagnostic imaging of other parts of musculoskeletal system: Secondary | ICD-10-CM | POA: Diagnosis not present

## 2017-03-01 DIAGNOSIS — M79605 Pain in left leg: Secondary | ICD-10-CM | POA: Diagnosis not present

## 2017-04-02 DIAGNOSIS — M5136 Other intervertebral disc degeneration, lumbar region: Secondary | ICD-10-CM | POA: Diagnosis not present

## 2017-04-02 DIAGNOSIS — M47816 Spondylosis without myelopathy or radiculopathy, lumbar region: Secondary | ICD-10-CM | POA: Diagnosis not present

## 2017-04-02 DIAGNOSIS — M5416 Radiculopathy, lumbar region: Secondary | ICD-10-CM | POA: Diagnosis not present

## 2017-04-10 DIAGNOSIS — Z6831 Body mass index (BMI) 31.0-31.9, adult: Secondary | ICD-10-CM | POA: Diagnosis not present

## 2017-04-10 DIAGNOSIS — J4521 Mild intermittent asthma with (acute) exacerbation: Secondary | ICD-10-CM | POA: Diagnosis not present

## 2017-04-10 DIAGNOSIS — I10 Essential (primary) hypertension: Secondary | ICD-10-CM | POA: Diagnosis not present

## 2017-04-10 DIAGNOSIS — M545 Low back pain: Secondary | ICD-10-CM | POA: Diagnosis not present

## 2017-04-27 DIAGNOSIS — M5136 Other intervertebral disc degeneration, lumbar region: Secondary | ICD-10-CM | POA: Diagnosis not present

## 2017-04-27 DIAGNOSIS — M5416 Radiculopathy, lumbar region: Secondary | ICD-10-CM | POA: Diagnosis not present

## 2017-06-13 DIAGNOSIS — H5203 Hypermetropia, bilateral: Secondary | ICD-10-CM | POA: Diagnosis not present

## 2017-07-10 DIAGNOSIS — J4521 Mild intermittent asthma with (acute) exacerbation: Secondary | ICD-10-CM | POA: Diagnosis not present

## 2017-07-10 DIAGNOSIS — Z6831 Body mass index (BMI) 31.0-31.9, adult: Secondary | ICD-10-CM | POA: Diagnosis not present

## 2017-07-10 DIAGNOSIS — M545 Low back pain: Secondary | ICD-10-CM | POA: Diagnosis not present

## 2017-07-10 DIAGNOSIS — I10 Essential (primary) hypertension: Secondary | ICD-10-CM | POA: Diagnosis not present

## 2017-10-08 DIAGNOSIS — J4521 Mild intermittent asthma with (acute) exacerbation: Secondary | ICD-10-CM | POA: Diagnosis not present

## 2017-10-08 DIAGNOSIS — Z6831 Body mass index (BMI) 31.0-31.9, adult: Secondary | ICD-10-CM | POA: Diagnosis not present

## 2017-10-08 DIAGNOSIS — I10 Essential (primary) hypertension: Secondary | ICD-10-CM | POA: Diagnosis not present

## 2017-10-08 DIAGNOSIS — M545 Low back pain: Secondary | ICD-10-CM | POA: Diagnosis not present

## 2017-10-11 DIAGNOSIS — Z1231 Encounter for screening mammogram for malignant neoplasm of breast: Secondary | ICD-10-CM | POA: Diagnosis not present

## 2017-10-17 DIAGNOSIS — M25551 Pain in right hip: Secondary | ICD-10-CM | POA: Diagnosis not present

## 2017-10-17 DIAGNOSIS — M25552 Pain in left hip: Secondary | ICD-10-CM | POA: Diagnosis not present

## 2018-01-08 DIAGNOSIS — J4521 Mild intermittent asthma with (acute) exacerbation: Secondary | ICD-10-CM | POA: Diagnosis not present

## 2018-01-08 DIAGNOSIS — M545 Low back pain: Secondary | ICD-10-CM | POA: Diagnosis not present

## 2018-01-08 DIAGNOSIS — Z0000 Encounter for general adult medical examination without abnormal findings: Secondary | ICD-10-CM | POA: Diagnosis not present

## 2018-01-08 DIAGNOSIS — I10 Essential (primary) hypertension: Secondary | ICD-10-CM | POA: Diagnosis not present

## 2018-01-08 DIAGNOSIS — Z6831 Body mass index (BMI) 31.0-31.9, adult: Secondary | ICD-10-CM | POA: Diagnosis not present

## 2018-01-08 DIAGNOSIS — Z1389 Encounter for screening for other disorder: Secondary | ICD-10-CM | POA: Diagnosis not present

## 2018-02-19 DIAGNOSIS — L821 Other seborrheic keratosis: Secondary | ICD-10-CM | POA: Diagnosis not present

## 2018-02-19 DIAGNOSIS — L570 Actinic keratosis: Secondary | ICD-10-CM | POA: Diagnosis not present

## 2018-02-19 DIAGNOSIS — D229 Melanocytic nevi, unspecified: Secondary | ICD-10-CM | POA: Diagnosis not present

## 2018-03-27 DIAGNOSIS — H33311 Horseshoe tear of retina without detachment, right eye: Secondary | ICD-10-CM | POA: Diagnosis not present

## 2018-03-27 DIAGNOSIS — H43391 Other vitreous opacities, right eye: Secondary | ICD-10-CM | POA: Diagnosis not present

## 2018-03-27 DIAGNOSIS — H43813 Vitreous degeneration, bilateral: Secondary | ICD-10-CM | POA: Diagnosis not present

## 2018-03-27 DIAGNOSIS — H4311 Vitreous hemorrhage, right eye: Secondary | ICD-10-CM | POA: Diagnosis not present

## 2018-03-28 DIAGNOSIS — I728 Aneurysm of other specified arteries: Secondary | ICD-10-CM | POA: Diagnosis not present

## 2018-03-28 DIAGNOSIS — H4311 Vitreous hemorrhage, right eye: Secondary | ICD-10-CM | POA: Diagnosis not present

## 2018-03-28 DIAGNOSIS — H35041 Retinal micro-aneurysms, unspecified, right eye: Secondary | ICD-10-CM | POA: Diagnosis not present

## 2018-03-29 DIAGNOSIS — H33311 Horseshoe tear of retina without detachment, right eye: Secondary | ICD-10-CM | POA: Diagnosis not present

## 2018-03-29 DIAGNOSIS — H4311 Vitreous hemorrhage, right eye: Secondary | ICD-10-CM | POA: Diagnosis not present

## 2018-04-11 DIAGNOSIS — Z6830 Body mass index (BMI) 30.0-30.9, adult: Secondary | ICD-10-CM | POA: Diagnosis not present

## 2018-04-11 DIAGNOSIS — Z0000 Encounter for general adult medical examination without abnormal findings: Secondary | ICD-10-CM | POA: Diagnosis not present

## 2018-06-04 DIAGNOSIS — Z6831 Body mass index (BMI) 31.0-31.9, adult: Secondary | ICD-10-CM | POA: Diagnosis not present

## 2018-06-04 DIAGNOSIS — S91312A Laceration without foreign body, left foot, initial encounter: Secondary | ICD-10-CM | POA: Diagnosis not present

## 2018-06-18 DIAGNOSIS — D229 Melanocytic nevi, unspecified: Secondary | ICD-10-CM | POA: Diagnosis not present

## 2018-06-18 DIAGNOSIS — L281 Prurigo nodularis: Secondary | ICD-10-CM | POA: Diagnosis not present

## 2018-06-18 DIAGNOSIS — L821 Other seborrheic keratosis: Secondary | ICD-10-CM | POA: Diagnosis not present

## 2018-07-02 DIAGNOSIS — M175 Other unilateral secondary osteoarthritis of knee: Secondary | ICD-10-CM | POA: Diagnosis not present

## 2018-07-02 DIAGNOSIS — Z6831 Body mass index (BMI) 31.0-31.9, adult: Secondary | ICD-10-CM | POA: Diagnosis not present

## 2018-08-05 DIAGNOSIS — M545 Low back pain: Secondary | ICD-10-CM | POA: Diagnosis not present

## 2018-08-05 DIAGNOSIS — M175 Other unilateral secondary osteoarthritis of knee: Secondary | ICD-10-CM | POA: Diagnosis not present

## 2018-08-05 DIAGNOSIS — I10 Essential (primary) hypertension: Secondary | ICD-10-CM | POA: Diagnosis not present

## 2018-08-05 DIAGNOSIS — J45909 Unspecified asthma, uncomplicated: Secondary | ICD-10-CM | POA: Diagnosis not present

## 2018-08-06 DIAGNOSIS — M1711 Unilateral primary osteoarthritis, right knee: Secondary | ICD-10-CM | POA: Diagnosis not present

## 2018-09-03 DIAGNOSIS — M1711 Unilateral primary osteoarthritis, right knee: Secondary | ICD-10-CM | POA: Diagnosis not present

## 2018-09-10 DIAGNOSIS — M1711 Unilateral primary osteoarthritis, right knee: Secondary | ICD-10-CM | POA: Diagnosis not present

## 2018-09-17 DIAGNOSIS — M1711 Unilateral primary osteoarthritis, right knee: Secondary | ICD-10-CM | POA: Diagnosis not present

## 2018-11-11 DIAGNOSIS — J45909 Unspecified asthma, uncomplicated: Secondary | ICD-10-CM | POA: Diagnosis not present

## 2018-11-11 DIAGNOSIS — I10 Essential (primary) hypertension: Secondary | ICD-10-CM | POA: Diagnosis not present

## 2018-11-11 DIAGNOSIS — M175 Other unilateral secondary osteoarthritis of knee: Secondary | ICD-10-CM | POA: Diagnosis not present

## 2018-11-11 DIAGNOSIS — M545 Low back pain: Secondary | ICD-10-CM | POA: Diagnosis not present

## 2018-11-14 DIAGNOSIS — M48061 Spinal stenosis, lumbar region without neurogenic claudication: Secondary | ICD-10-CM | POA: Diagnosis not present

## 2018-11-14 DIAGNOSIS — M7138 Other bursal cyst, other site: Secondary | ICD-10-CM | POA: Diagnosis not present

## 2018-11-14 DIAGNOSIS — M5126 Other intervertebral disc displacement, lumbar region: Secondary | ICD-10-CM | POA: Diagnosis not present

## 2018-11-14 DIAGNOSIS — M545 Low back pain: Secondary | ICD-10-CM | POA: Diagnosis not present

## 2018-11-21 ENCOUNTER — Other Ambulatory Visit: Payer: Self-pay | Admitting: Internal Medicine

## 2018-11-21 DIAGNOSIS — M545 Low back pain, unspecified: Secondary | ICD-10-CM

## 2018-12-06 ENCOUNTER — Other Ambulatory Visit: Payer: Self-pay | Admitting: Internal Medicine

## 2018-12-06 DIAGNOSIS — M545 Low back pain, unspecified: Secondary | ICD-10-CM

## 2018-12-20 ENCOUNTER — Ambulatory Visit
Admission: RE | Admit: 2018-12-20 | Discharge: 2018-12-20 | Disposition: A | Discharge status: Home / Self Care | Payer: Medicare Other | Source: Ambulatory Visit | Attending: Internal Medicine | Admitting: Internal Medicine

## 2018-12-20 ENCOUNTER — Other Ambulatory Visit: Payer: Self-pay

## 2018-12-20 DIAGNOSIS — M545 Low back pain, unspecified: Secondary | ICD-10-CM

## 2018-12-20 DIAGNOSIS — M5416 Radiculopathy, lumbar region: Secondary | ICD-10-CM | POA: Diagnosis not present

## 2018-12-20 MED ORDER — IOPAMIDOL (ISOVUE-M 200) INJECTION 41%: 1.0000 mL | Freq: Once | INTRAMUSCULAR | Status: DC

## 2018-12-20 MED ORDER — METHYLPREDNISOLONE ACETATE 40 MG/ML INJ SUSP (RADIOLOG: 120.0000 mg | Freq: Once | INTRAMUSCULAR | Status: DC

## 2018-12-20 NOTE — Discharge Instructions (Signed)

## 2018-12-30 DIAGNOSIS — Z1231 Encounter for screening mammogram for malignant neoplasm of breast: Secondary | ICD-10-CM | POA: Diagnosis not present

## 2019-01-09 ENCOUNTER — Ambulatory Visit (INDEPENDENT_AMBULATORY_CARE_PROVIDER_SITE_OTHER): Payer: Medicare Other | Admitting: Otolaryngology

## 2019-01-09 DIAGNOSIS — H6121 Impacted cerumen, right ear: Secondary | ICD-10-CM | POA: Diagnosis not present

## 2019-01-09 DIAGNOSIS — H608X3 Other otitis externa, bilateral: Secondary | ICD-10-CM | POA: Diagnosis not present

## 2019-01-09 DIAGNOSIS — H903 Sensorineural hearing loss, bilateral: Secondary | ICD-10-CM

## 2019-01-09 DIAGNOSIS — R42 Dizziness and giddiness: Secondary | ICD-10-CM

## 2019-01-23 DIAGNOSIS — Z9851 Tubal ligation status: Secondary | ICD-10-CM | POA: Diagnosis not present

## 2019-01-23 DIAGNOSIS — I10 Essential (primary) hypertension: Secondary | ICD-10-CM | POA: Diagnosis not present

## 2019-01-23 DIAGNOSIS — Z7982 Long term (current) use of aspirin: Secondary | ICD-10-CM | POA: Diagnosis not present

## 2019-01-23 DIAGNOSIS — Z881 Allergy status to other antibiotic agents status: Secondary | ICD-10-CM | POA: Diagnosis not present

## 2019-01-23 DIAGNOSIS — J45909 Unspecified asthma, uncomplicated: Secondary | ICD-10-CM | POA: Diagnosis not present

## 2019-01-23 DIAGNOSIS — Z20828 Contact with and (suspected) exposure to other viral communicable diseases: Secondary | ICD-10-CM | POA: Diagnosis not present

## 2019-01-23 DIAGNOSIS — Z96643 Presence of artificial hip joint, bilateral: Secondary | ICD-10-CM | POA: Diagnosis not present

## 2019-01-23 DIAGNOSIS — K529 Noninfective gastroenteritis and colitis, unspecified: Secondary | ICD-10-CM | POA: Diagnosis not present

## 2019-01-23 DIAGNOSIS — G8929 Other chronic pain: Secondary | ICD-10-CM | POA: Diagnosis not present

## 2019-01-23 DIAGNOSIS — N390 Urinary tract infection, site not specified: Secondary | ICD-10-CM | POA: Diagnosis not present

## 2019-01-23 DIAGNOSIS — Z90710 Acquired absence of both cervix and uterus: Secondary | ICD-10-CM | POA: Diagnosis not present

## 2019-01-23 DIAGNOSIS — Z79899 Other long term (current) drug therapy: Secondary | ICD-10-CM | POA: Diagnosis not present

## 2019-01-23 DIAGNOSIS — M549 Dorsalgia, unspecified: Secondary | ICD-10-CM | POA: Diagnosis not present

## 2019-01-23 DIAGNOSIS — B961 Klebsiella pneumoniae [K. pneumoniae] as the cause of diseases classified elsewhere: Secondary | ICD-10-CM | POA: Diagnosis not present

## 2019-01-23 DIAGNOSIS — E860 Dehydration: Secondary | ICD-10-CM | POA: Diagnosis not present

## 2019-01-23 DIAGNOSIS — M160 Bilateral primary osteoarthritis of hip: Secondary | ICD-10-CM | POA: Diagnosis not present

## 2019-01-24 DIAGNOSIS — Z881 Allergy status to other antibiotic agents status: Secondary | ICD-10-CM | POA: Diagnosis not present

## 2019-01-24 DIAGNOSIS — I10 Essential (primary) hypertension: Secondary | ICD-10-CM | POA: Diagnosis not present

## 2019-01-24 DIAGNOSIS — G8929 Other chronic pain: Secondary | ICD-10-CM | POA: Diagnosis not present

## 2019-01-24 DIAGNOSIS — B961 Klebsiella pneumoniae [K. pneumoniae] as the cause of diseases classified elsewhere: Secondary | ICD-10-CM | POA: Diagnosis not present

## 2019-01-24 DIAGNOSIS — Z20828 Contact with and (suspected) exposure to other viral communicable diseases: Secondary | ICD-10-CM | POA: Diagnosis not present

## 2019-01-24 DIAGNOSIS — E860 Dehydration: Secondary | ICD-10-CM | POA: Diagnosis not present

## 2019-01-24 DIAGNOSIS — N390 Urinary tract infection, site not specified: Secondary | ICD-10-CM | POA: Diagnosis not present

## 2019-01-24 DIAGNOSIS — Z79899 Other long term (current) drug therapy: Secondary | ICD-10-CM | POA: Diagnosis not present

## 2019-01-24 DIAGNOSIS — Z7982 Long term (current) use of aspirin: Secondary | ICD-10-CM | POA: Diagnosis not present

## 2019-01-24 DIAGNOSIS — Z90710 Acquired absence of both cervix and uterus: Secondary | ICD-10-CM | POA: Diagnosis not present

## 2019-01-24 DIAGNOSIS — J45909 Unspecified asthma, uncomplicated: Secondary | ICD-10-CM | POA: Diagnosis not present

## 2019-01-24 DIAGNOSIS — M160 Bilateral primary osteoarthritis of hip: Secondary | ICD-10-CM | POA: Diagnosis not present

## 2019-01-24 DIAGNOSIS — M549 Dorsalgia, unspecified: Secondary | ICD-10-CM | POA: Diagnosis not present

## 2019-01-24 DIAGNOSIS — Z9851 Tubal ligation status: Secondary | ICD-10-CM | POA: Diagnosis not present

## 2019-01-24 DIAGNOSIS — Z96643 Presence of artificial hip joint, bilateral: Secondary | ICD-10-CM | POA: Diagnosis not present

## 2019-01-24 DIAGNOSIS — K529 Noninfective gastroenteritis and colitis, unspecified: Secondary | ICD-10-CM | POA: Diagnosis not present

## 2019-02-05 DIAGNOSIS — E860 Dehydration: Secondary | ICD-10-CM | POA: Diagnosis not present

## 2019-02-05 DIAGNOSIS — N3090 Cystitis, unspecified without hematuria: Secondary | ICD-10-CM | POA: Diagnosis not present

## 2019-02-05 DIAGNOSIS — Z6832 Body mass index (BMI) 32.0-32.9, adult: Secondary | ICD-10-CM | POA: Diagnosis not present

## 2019-02-05 DIAGNOSIS — R3981 Functional urinary incontinence: Secondary | ICD-10-CM | POA: Diagnosis not present

## 2019-02-26 DIAGNOSIS — M25511 Pain in right shoulder: Secondary | ICD-10-CM | POA: Diagnosis not present

## 2019-02-26 DIAGNOSIS — M19011 Primary osteoarthritis, right shoulder: Secondary | ICD-10-CM | POA: Diagnosis not present

## 2019-02-26 DIAGNOSIS — M719 Bursopathy, unspecified: Secondary | ICD-10-CM | POA: Diagnosis not present

## 2019-02-26 DIAGNOSIS — M19012 Primary osteoarthritis, left shoulder: Secondary | ICD-10-CM | POA: Diagnosis not present

## 2019-03-25 DIAGNOSIS — M719 Bursopathy, unspecified: Secondary | ICD-10-CM | POA: Diagnosis not present

## 2019-03-25 DIAGNOSIS — Z96643 Presence of artificial hip joint, bilateral: Secondary | ICD-10-CM | POA: Diagnosis not present

## 2019-03-25 DIAGNOSIS — M19011 Primary osteoarthritis, right shoulder: Secondary | ICD-10-CM | POA: Diagnosis not present

## 2019-03-25 DIAGNOSIS — M19012 Primary osteoarthritis, left shoulder: Secondary | ICD-10-CM | POA: Diagnosis not present

## 2019-04-14 DIAGNOSIS — Z6831 Body mass index (BMI) 31.0-31.9, adult: Secondary | ICD-10-CM | POA: Diagnosis not present

## 2019-04-14 DIAGNOSIS — M10071 Idiopathic gout, right ankle and foot: Secondary | ICD-10-CM | POA: Diagnosis not present

## 2019-04-23 DIAGNOSIS — M19011 Primary osteoarthritis, right shoulder: Secondary | ICD-10-CM | POA: Diagnosis not present

## 2019-05-15 DIAGNOSIS — Z96642 Presence of left artificial hip joint: Secondary | ICD-10-CM | POA: Diagnosis not present

## 2019-05-15 DIAGNOSIS — Z471 Aftercare following joint replacement surgery: Secondary | ICD-10-CM | POA: Diagnosis not present

## 2019-05-15 DIAGNOSIS — M25552 Pain in left hip: Secondary | ICD-10-CM | POA: Diagnosis not present

## 2019-05-15 DIAGNOSIS — Z96643 Presence of artificial hip joint, bilateral: Secondary | ICD-10-CM | POA: Diagnosis not present

## 2019-06-16 ENCOUNTER — Encounter: Payer: Self-pay | Admitting: Neurology

## 2019-06-16 ENCOUNTER — Ambulatory Visit: Payer: Medicare Other | Admitting: Neurology

## 2019-06-16 ENCOUNTER — Other Ambulatory Visit: Payer: Self-pay

## 2019-06-16 VITALS — BP 154/69 | HR 57 | Temp 97.1°F | Ht 62.0 in | Wt 173.3 lb

## 2019-06-16 DIAGNOSIS — M5416 Radiculopathy, lumbar region: Secondary | ICD-10-CM | POA: Insufficient documentation

## 2019-06-16 DIAGNOSIS — M79605 Pain in left leg: Secondary | ICD-10-CM | POA: Insufficient documentation

## 2019-06-16 DIAGNOSIS — Z8673 Personal history of transient ischemic attack (TIA), and cerebral infarction without residual deficits: Secondary | ICD-10-CM | POA: Insufficient documentation

## 2019-06-16 DIAGNOSIS — Z87448 Personal history of other diseases of urinary system: Secondary | ICD-10-CM | POA: Insufficient documentation

## 2019-06-16 MED ORDER — MELOXICAM 7.5 MG PO TABS: 7.5000 mg | ORAL_TABLET | Freq: Every day | ORAL | 5 refills | Status: DC

## 2019-06-16 NOTE — Progress Notes (Signed)
[name] DOB: 1935/03/10  REFERRING DOCTOR OR PCP:  Alisha Murphy (ortho); PCP is Dr. Sherrie Sport SOURCE: Patient, son, notes from Dr. Case, imaging report, MRI images personally reviewed.  _________________________________   HISTORICAL  CHIEF COMPLAINT:  Chief Complaint  Patient presents with  . New Patient (Initial Visit)    RM 68 with son Alisha Murphy) referred by Remo Lipps Case, MD for left leg pain  . Left Leg Pain    reports pain and numbness has been present has been present for the last few weeks hx of stroke ( 3 years ago)  denies any falls.     HISTORY OF PRESENT ILLNESS:  I had the pleasure seeing your patient, Alisha Murphy, at [facility] for neurologic consultation regarding her left leg pain.  She is an 84 year old woman with left leg pain that started about 4 to 6 weeks ago..  Symptoms began with numbness in her leg about 6 weeks ago in the outer leg.  The numbness was located down the leg from the hip towards the ankle but not into the foot.   She also has pain in the leg in a similar distribution.  Over the last couple weeks, when she stands up, pain is worse and shifting around (circle motions with left leg) helps.   Then she is walking ok again.   Activity like sweeping or vacuuming increases her back pain and walking increases her left leg pain.      Sitting for a while helps the pain.     She has known degenerative changes in her spine and also has had bilateral hip replacements about 10 years ago due to degenerative changes.  MRI shows DDD/DJD worse at [facility] with mild spinal stenosis and mild to moderate bilateral lateral recess stenosis --- no definite nerve root compression.   She had a right L4L5 ESI in May that helped her some.      She notes having a stroke in 2017 (multiple with [place]).  She had sepsis with low BP due to MRSA.  She had surgery for drainage (had MRSA in bursa) and stroke  occurred at that time.   She had rehab a while afterwards.  Of note, the stroke seem to affect her cognitive skills more than motor or sensory function.  She has neuropathy in her legs and takes gabapentin 100 mg po tid (gabapentin makes her sleepy so dose can't easily be pushed up).     I personally reviewed the MRI of the lumbar spine.  The radiologist impression was: T11-12: Small paracentral disc protrusion and moderate facet arthrosis without stenosis, unchanged. T12-L1: Minimal disc bulging without stenosis, unchanged. L1-2: Mild facet hypertrophy without disc herniation or stenosis, unchanged. L2-3: Mild facet hypertrophy without disc herniation or stenosis, unchanged. L3-4: Circumferential disc bulging, increased size of a small central disc protrusion, mild to moderate facet and ligamentum flavum hypertrophy, and a new approximately 15 x 4 mm synovial cyst along the ligamentum flavum on the left result in new mild spinal stenosis and mild left lateral recess stenosis without significant neural foraminal stenosis. L4-5: Mild disc bulging and moderate facet and ligamentum flavum hypertrophy without stenosis, unchanged. L5-S1: Severe right and mild-to-moderate left facet hypertrophy without disc herniation or stenosis, unchanged.  I personally reviewed the images.  There is moderate foraminal narrowing to the left at L3-L4 with some encroachment upon the left L4 nerve root though there is not any definite nerve root compression.  I also compared the MRI to the MRI from 12/14/2016 and agree that the changes at L3-L4 have progressed.  REVIEW OF SYSTEMS: Constitutional: No fevers, chills, sweats, or change in appetite Eyes: No visual changes, double vision, eye pain Ear, nose and throat: No hearing loss, ear pain, nasal congestion, sore throat Cardiovascular: No chest pain, palpitations Respiratory: No shortness of breath at rest or with exertion.   No wheezes GastrointestinaI: No  nausea, vomiting, diarrhea, abdominal pain, fecal incontinence Genitourinary: No dysuria, urinary retention or frequency.  No nocturia. Musculoskeletal: No neck pain, back pain Integumentary: No rash, pruritus, skin lesions Neurological: as above Psychiatric: No depression at this time.  No anxiety Endocrine: No palpitations, diaphoresis, change in appetite, change in weigh or increased thirst Hematologic/Lymphatic: No anemia, purpura, petechiae. Allergic/Immunologic: No itchy/runny eyes, nasal congestion, recent allergic reactions, rashes  ALLERGIES: Allergies  Allergen Reactions  . Doxycycline Rash    HOME MEDICATIONS:  Current Outpatient Medications:  .  aspirin 81 MG chewable tablet, Chew by mouth., Disp: , Rfl:  .  budesonide-formoterol (SYMBICORT) 80-4.5 MCG/ACT inhaler, , Disp: , Rfl:  .  Calcium Carbonate-Vitamin D (OYSTER SHELL CALCIUM 500 + D) 500-125 MG-UNIT TABS, Take by mouth., Disp: , Rfl:  .  chlorthalidone (HYGROTON) 25 MG tablet, , Disp: , Rfl:  .  gabapentin (NEURONTIN) 100 MG capsule, Take by mouth., Disp: , Rfl:  .  metoprolol succinate (TOPROL-XL) 50 MG 24 hr tablet, , Disp: , Rfl:  .  Multiple Vitamins-Minerals (HAIR SKIN NAILS PO), Take by mouth., Disp: , Rfl:  .  Omega-3 1000 MG CAPS, Take by mouth., Disp: , Rfl:  .  vitamin B-12 (CYANOCOBALAMIN) 1000 MCG tablet, Take by mouth., Disp: , Rfl:  .  meloxicam (MOBIC) 7.5 MG tablet, Take 1 tablet (7.5 mg total) by mouth daily., Disp: 30 tablet, Rfl: 5  PAST MEDICAL HISTORY: Past Medical History:  Diagnosis Date  . Atrial fibrillation ([place])   . Cataract   . GERD (gastroesophageal reflux disease)   . Joint pain   . Stroke [facility])     PAST SURGICAL HISTORY: Past Surgical History:  Procedure Laterality Date  . CATARACT EXTRACTION    . HIP SURGERY    . HYSTERECTOMY ABDOMINAL WITH SALPINGECTOMY      FAMILY HISTORY: Family History  Problem Relation Age of Onset  . Cancer Mother   . Heart Problems  Father     SOCIAL HISTORY:  Social History   Socioeconomic History  . Marital status: Widowed    Spouse name: Not on file  . Number of children: 1  . Years of education: Not on file  . Highest education level: 12th grade  Occupational History  . Not on file  Social Needs  . Financial resource strain: Not on file  . Food insecurity    Worry: Not on file    Inability: Not on file  . Transportation needs    Medical: Not on file    Non-medical: Not on file  Tobacco Use  . Smoking status: Never Smoker  . Smokeless tobacco: Never Used  Substance and Sexual Activity  . Alcohol use: Not Currently  . Drug use: Never  . Sexual activity: Not on file  Lifestyle  . Physical activity    Days per week: Not on file    Minutes per session: Not on file  . Stress: Not on file  Relationships  . Social Herbalist on phone: Not on file    Gets together:  Not on file    Attends religious service: Not on file    Active member of club or organization: Not on file    Attends meetings of clubs or organizations: Not on file    Relationship status: Not on file  . Intimate partner violence    Fear of current or ex partner: Not on file    Emotionally abused: Not on file    Physically abused: Not on file    Forced sexual activity: Not on file  Other Topics Concern  . Not on file  Social History Narrative   Lives at home alone    2 cups of caffeine per day      PHYSICAL EXAM  Vitals:   06/16/19 1037  BP: (!) 154/69  Pulse: (!) 57  Temp: (!) 97.1 F (36.2 C)  TempSrc: Temporal  Weight: 173 lb 5 oz (78.6 kg)  Height: 5\' 2"  (1.575 m)    Body mass index is 31.7 kg/m.   General: The patient is well-developed and well-nourished and in no acute distress  HEENT:  Head is [place]/AT.  Sclera are anicteric.    Neck: No carotid bruits are noted.  The neck is nontender.  Cardiovascular: The heart has a regular rate and rhythm with a normal S1 and S2. There were no murmurs,  gallops or rubs.    Skin: Extremities are without rash or  edema.  Musculoskeletal:  Back is nontender over lumbar paraspinal muscles.  She has moderate tenderness over the left piriformis muscle.  No tenderness over the trochanteric bursae.  She had a mild left FABER sign.  Neurologic Exam  Mental status: The patient is alert and oriented x 3 at the time of the examination. The patient has apparent normal recent and remote memory, with an apparently normal attention span and concentration ability.   Speech is normal.  Cranial nerves: Extraocular movements are full. Pupils are equal, round, and reactive to light and accomodation.  Visual fields are full.  Facial symmetry is present. There is good facial sensation to soft touch bilaterally.Facial strength is normal.  Trapezius and sternocleidomastoid strength is normal. No dysarthria is noted.  The tongue is midline, and the patient has symmetric elevation of the soft palate. No obvious hearing deficits are noted.  Motor:  Muscle bulk is normal.   Tone is normal. Strength is  5 / 5 in all 4 extremities.   Sensory: Sensory testing shows reduced vibration sensation in both feet.  Sensory to touch, temperature and vibration was symmetric.  Coordination: Cerebellar testing reveals good finger-nose-finger and heel-to-shin bilaterally.  Gait and station: Station is normal.   The gait is arthritic in the stride is reduced.  She turns in 4 steps.  She is unable to tandem walk.. Romberg is negative.   Reflexes: Deep tendon reflexes are symmetric and normal in the arms but trace at the knees and absent at the ankles..   Plantar responses are flexor.    DIAGNOSTIC DATA (LABS, IMAGING, TESTING) - I reviewed patient records, labs, notes, testing and imaging myself where available.      ASSESSMENT AND PLAN  Lumbar radiculopathy - Plan: ESI  History of acute renal failure - Plan: CMP, CBC with Differential/Platelets  Left leg pain  History of  ischemic stroke   Symptoms of the left leg are most likely due to an L4 radiculopathy.  Alternatively, she could have a left piriformis syndrome as she has tenderness over the piriformis muscle.  This is less likely  than the radiculopathy as she does not note discomfort sitting on the left buttock..  1.   ESI for left L4 radiculopathy 2.   Meloxicam --- Check CBC and CMP and stop the meloxicam if creatinine is elevated 3.   May stop am gabapentin as makes her sleepy 4.   If not better, consider tramadol 25-50 mg up to tid. 5,   Also, if not better, consider referral for PT 6.   Follow-up in 3 months or sooner if there are new or worsening neurologic symptoms.  Thank you for asking me to see Alisha Murphy.  Please let me know if I can be of further assistance with her or other patients in the future.  Micheal Sheen A. Felecia Shelling, MD, [facility] [id number], [id number] PM Certified in Neurology, Clinical Neurophysiology, Sleep Medicine and Neuroimaging  [facility] [address] [place], [place] [address] [phone]

## 2019-06-17 ENCOUNTER — Telehealth: Payer: Self-pay

## 2019-06-17 LAB — CBC WITH DIFFERENTIAL/PLATELET
Basophils Absolute: 0.1 10*3/uL (ref 0.0–0.2)
Basos: 1 %
EOS (ABSOLUTE): 0.2 10*3/uL (ref 0.0–0.4)
Eos: 3 %
Hematocrit: 41.5 % (ref 34.0–46.6)
Hemoglobin: 13.9 g/dL (ref 11.1–15.9)
Immature Grans (Abs): 0 10*3/uL (ref 0.0–0.1)
Immature Granulocytes: 0 %
Lymphocytes Absolute: 2.3 10*3/uL (ref 0.7–3.1)
Lymphs: 32 %
MCH: 33.7 pg — ABNORMAL HIGH (ref 26.6–33.0)
MCHC: 33.5 g/dL (ref 31.5–35.7)
MCV: 101 fL — ABNORMAL HIGH (ref 79–97)
Monocytes Absolute: 0.7 10*3/uL (ref 0.1–0.9)
Monocytes: 9 %
Neutrophils Absolute: 3.9 10*3/uL (ref 1.4–7.0)
Neutrophils: 55 %
Platelets: 186 10*3/uL (ref 150–450)
RBC: 4.13 x10E6/uL (ref 3.77–5.28)
RDW: 13.3 % (ref 11.7–15.4)
WBC: 7 10*3/uL (ref 3.4–10.8)

## 2019-06-17 LAB — COMPREHENSIVE METABOLIC PANEL
ALT: 14 IU/L (ref 0–32)
AST: 22 IU/L (ref 0–40)
Albumin/Globulin Ratio: 1.8 (ref 1.2–2.2)
Albumin: 4.4 g/dL (ref 3.6–4.6)
Alkaline Phosphatase: 130 IU/L — ABNORMAL HIGH (ref 39–117)
BUN/Creatinine Ratio: 30 — ABNORMAL HIGH (ref 12–28)
BUN: 24 mg/dL (ref 8–27)
Bilirubin Total: 0.5 mg/dL (ref 0.0–1.2)
CO2: 29 mmol/L (ref 20–29)
Calcium: 10 mg/dL (ref 8.7–10.3)
Chloride: 101 mmol/L (ref 96–106)
Creatinine, Ser: 0.79 mg/dL (ref 0.57–1.00)
GFR calc Af Amer: 81 mL/min/{1.73_m2} (ref >59)
GFR calc non Af Amer: 70 mL/min/{1.73_m2} (ref >59)
Globulin, Total: 2.5 g/dL (ref 1.5–4.5)
Glucose: 114 mg/dL — ABNORMAL HIGH (ref 65–99)
Potassium: 4.2 mmol/L (ref 3.5–5.2)
Sodium: 144 mmol/L (ref 134–144)
Total Protein: 6.9 g/dL (ref 6.0–8.5)

## 2019-06-17 NOTE — Telephone Encounter (Signed)
-----   Message from Britt Bottom, MD sent at 06/17/2019 10:12 AM EST ----- Please let her know that the labs showed good kidney function.  Therefore, it is okay for her to go ahead and start the meloxicam

## 2019-06-17 NOTE — Telephone Encounter (Signed)
I contacted the pt and left a vm ( ok per dpr) advising of result and ok to start Meloxicam. Pt advised to call back if she had any further questions.

## 2019-06-23 ENCOUNTER — Other Ambulatory Visit: Payer: Self-pay

## 2019-06-23 ENCOUNTER — Ambulatory Visit
Admission: RE | Admit: 2019-06-23 | Discharge: 2019-06-23 | Disposition: A | Discharge status: Home / Self Care | Payer: Medicare Other | Source: Ambulatory Visit | Attending: Neurology | Admitting: Neurology

## 2019-06-23 DIAGNOSIS — M5416 Radiculopathy, lumbar region: Secondary | ICD-10-CM

## 2019-06-23 DIAGNOSIS — M48061 Spinal stenosis, lumbar region without neurogenic claudication: Secondary | ICD-10-CM | POA: Diagnosis not present

## 2019-06-23 MED ORDER — IOPAMIDOL (ISOVUE-M 200) INJECTION 41%
1.0000 mL | Freq: Once | INTRAMUSCULAR | Status: AC
  Administered 2019-06-23: 1 mL via EPIDURAL

## 2019-06-23 MED ORDER — METHYLPREDNISOLONE ACETATE 40 MG/ML INJ SUSP (RADIOLOG
120.0000 mg | Freq: Once | INTRAMUSCULAR | Status: AC
  Administered 2019-06-23: 120 mg via EPIDURAL

## 2019-06-23 NOTE — Discharge Instructions (Signed)

## 2019-07-07 DIAGNOSIS — I10 Essential (primary) hypertension: Secondary | ICD-10-CM | POA: Diagnosis not present

## 2019-07-07 DIAGNOSIS — H019 Unspecified inflammation of eyelid: Secondary | ICD-10-CM | POA: Diagnosis not present

## 2019-07-07 DIAGNOSIS — Z6831 Body mass index (BMI) 31.0-31.9, adult: Secondary | ICD-10-CM | POA: Diagnosis not present

## 2019-07-22 DIAGNOSIS — I10 Essential (primary) hypertension: Secondary | ICD-10-CM | POA: Diagnosis not present

## 2019-07-22 DIAGNOSIS — M71552 Other bursitis, not elsewhere classified, left hip: Secondary | ICD-10-CM | POA: Diagnosis not present

## 2019-07-22 DIAGNOSIS — Z6831 Body mass index (BMI) 31.0-31.9, adult: Secondary | ICD-10-CM | POA: Diagnosis not present

## 2019-07-22 DIAGNOSIS — J4520 Mild intermittent asthma, uncomplicated: Secondary | ICD-10-CM | POA: Diagnosis not present

## 2019-08-26 DIAGNOSIS — Z6831 Body mass index (BMI) 31.0-31.9, adult: Secondary | ICD-10-CM | POA: Diagnosis not present

## 2019-08-26 DIAGNOSIS — L309 Dermatitis, unspecified: Secondary | ICD-10-CM | POA: Diagnosis not present

## 2019-09-16 ENCOUNTER — Ambulatory Visit: Payer: Medicare Other | Admitting: Family Medicine

## 2019-10-07 DIAGNOSIS — H5203 Hypermetropia, bilateral: Secondary | ICD-10-CM | POA: Diagnosis not present

## 2019-10-08 DIAGNOSIS — M79672 Pain in left foot: Secondary | ICD-10-CM | POA: Diagnosis not present

## 2019-10-08 DIAGNOSIS — M79671 Pain in right foot: Secondary | ICD-10-CM | POA: Diagnosis not present

## 2019-10-08 DIAGNOSIS — M2042 Other hammer toe(s) (acquired), left foot: Secondary | ICD-10-CM | POA: Diagnosis not present

## 2019-10-08 DIAGNOSIS — M2041 Other hammer toe(s) (acquired), right foot: Secondary | ICD-10-CM | POA: Diagnosis not present

## 2019-10-28 DIAGNOSIS — J4520 Mild intermittent asthma, uncomplicated: Secondary | ICD-10-CM | POA: Diagnosis not present

## 2019-10-28 DIAGNOSIS — I10 Essential (primary) hypertension: Secondary | ICD-10-CM | POA: Diagnosis not present

## 2019-10-28 DIAGNOSIS — L309 Dermatitis, unspecified: Secondary | ICD-10-CM | POA: Diagnosis not present

## 2019-10-28 DIAGNOSIS — Z6831 Body mass index (BMI) 31.0-31.9, adult: Secondary | ICD-10-CM | POA: Diagnosis not present

## 2019-11-05 ENCOUNTER — Encounter: Payer: Self-pay | Admitting: *Deleted

## 2019-11-05 ENCOUNTER — Encounter: Payer: Self-pay | Admitting: Dermatology

## 2019-11-05 ENCOUNTER — Ambulatory Visit: Payer: Medicare Other | Admitting: Dermatology

## 2019-11-05 ENCOUNTER — Other Ambulatory Visit: Payer: Self-pay

## 2019-11-05 DIAGNOSIS — C4492 Squamous cell carcinoma of skin, unspecified: Secondary | ICD-10-CM

## 2019-11-05 DIAGNOSIS — D692 Other nonthrombocytopenic purpura: Secondary | ICD-10-CM

## 2019-11-05 DIAGNOSIS — B079 Viral wart, unspecified: Secondary | ICD-10-CM | POA: Diagnosis not present

## 2019-11-05 DIAGNOSIS — D099 Carcinoma in situ, unspecified: Secondary | ICD-10-CM

## 2019-11-05 DIAGNOSIS — L719 Rosacea, unspecified: Secondary | ICD-10-CM | POA: Diagnosis not present

## 2019-11-05 DIAGNOSIS — D0462 Carcinoma in situ of skin of left upper limb, including shoulder: Secondary | ICD-10-CM | POA: Diagnosis not present

## 2019-11-05 DIAGNOSIS — D485 Neoplasm of uncertain behavior of skin: Secondary | ICD-10-CM | POA: Diagnosis not present

## 2019-11-05 DIAGNOSIS — D492 Neoplasm of unspecified behavior of bone, soft tissue, and skin: Secondary | ICD-10-CM

## 2019-11-05 HISTORY — DX: Carcinoma in situ, unspecified: D09.9

## 2019-11-05 HISTORY — DX: Squamous cell carcinoma of skin, unspecified: C44.92

## 2019-11-05 MED ORDER — METRONIDAZOLE 0.75 % EX CREA: TOPICAL_CREAM | Freq: Two times a day (BID) | CUTANEOUS | 2 refills | Status: AC

## 2019-11-05 NOTE — Progress Notes (Addendum)
Follow-Up Visit   Subjective  Alisha Murphy is a 85 y.o. female who presents for the following: Rash (face & nose for 2 months- no itch, no pain just looks bad-using neosporin -no help).  growths Location: Left arm and leg Duration: 6 months Quality: Increased size Associated Signs/Symptoms: Modifying Factors:  Severity:  Timing: Context:   The following portions of the chart were reviewed this encounter and updated as appropriate: Tobacco  Allergies  Meds  Problems  Med Hx  Surg Hx  Fam Hx      Objective  Well appearing patient in no apparent distress; mood and affect are within normal limits.  All sun exposed areas plus back examined. Plus legs. Sun exposed areas and back examined today.  On the back there are scattered tan benign keratoses which require no intervention and no atypical moles.  On the the arms are many ecchymoses indicative of solar purpura.  If these are bothersome to Alisha Murphy she may try some nonprescription Dermend once daily after bathing.  On the left wrist is a 1.1 cm thick crust which has been present for months and may represent a superficial nonmelanoma skin cancer; shave biopsy was obtained.  Alisha Murphy will call the office in 2 to 3 days to discuss that result.  Across her upper nose and cheeks are a half dozen very red mostly nonscaly half centimeter spots.  She has never had anything like this previously.  This represents a mixture of 3 factors: #1 in the skin inflammation, most likely mild rosacea; #2 minor sun damage; #3 roughly 75% of people are getting redness on their central face related to wearing Covid masks even when this is done occasionally but I encouraged her to continue wearing the Covid mask even though she has been double vaccinated.  Prescription given for 0.75% metronidazole cream to be applied to the red areas on the nose and cheeks once or twice daily for 6weeks.  Recheck at that time unless the facial rash is cleared in which case  she may cancel Assessment & Plan  Neoplasm of skin (2) Left Forearm - Posterior  Skin / nail biopsy Type of biopsy: tangential   Informed consent: discussed and consent obtained   Timeout: patient name, date of birth, surgical site, and procedure verified   Procedure prep:  Patient was prepped and draped in usual sterile fashion Prep type:  Chlorhexidine Anesthesia: the lesion was anesthetized in a standard fashion   Anesthetic:  1% lidocaine w/ epinephrine 1-100,000 local infiltration Instrument used: flexible razor blade   Hemostasis achieved with: ferric subsulfate   Outcome: patient tolerated procedure well   Post-procedure details: sterile dressing applied and wound care instructions given   Dressing type: petrolatum   Additional details:  Patient identified lesion of concern.  Lesion identified by physician.  Specimen 1 - Surgical pathology Differential Diagnosis:bcc vs scc Check Margins: No  Right Thigh - Anterior  Skin / nail biopsy Type of biopsy: tangential   Informed consent: discussed and consent obtained   Timeout: patient name, date of birth, surgical site, and procedure verified   Procedure prep:  Patient was prepped and draped in usual sterile fashion Prep type:  Chlorhexidine Anesthesia: the lesion was anesthetized in a standard fashion   Anesthetic:  1% lidocaine w/ epinephrine 1-100,000 local infiltration Instrument used: flexible razor blade   Hemostasis achieved with: ferric subsulfate   Outcome: patient tolerated procedure well   Post-procedure details: sterile dressing applied and wound care instructions  given   Dressing type: petrolatum   Additional details:  Patient identified lesion of concern.  Lesion identified by physician.  Specimen 2 - Surgical pathology Differential Diagnosis: rule out wart Check Margins: No  Rosacea (3) Dorsum of Nose; Left Malar Cheek; Right Malar Cheek  Topical 0.75% metronidazole  Senile purpura (HCC) (2) Left  Forearm - Posterior; Right Forearm - Posterior  Prn Dermend

## 2019-11-05 NOTE — Patient Instructions (Addendum)
Sun exposed areas and back examined today.  On the back there are scattered tan benign keratoses which require no intervention and no atypical moles.  On the the arms are many ecchymoses indicative of solar purpura.  If these are bothersome to Mrs. Franceschi she may try some nonprescription Dermend once daily after bathing.  On the left wrist is a 1.1 cm thick crust which has been present for months and may represent a superficial nonmelanoma skin cancer; shave biopsy was obtained.  Mrs. Bjerk will call the office in 2 to 3 days to discuss that result.  Across her upper nose and cheeks are a half dozen very red mostly nonscaly half centimeter spots.  She has never had anything like this previously.  This represents a mixture of 3 factors: #1 in the skin inflammation, most likely mild rosacea; #2 minor sun damage; #3 roughly 75% of people are getting redness on their central face related to wearing Covid masks even when this is done occasionally but I encouraged her to continue wearing the Covid mask even though she has been double vaccinated.  Prescription given for 0.75% metronidazole cream to be applied to the red areas on the nose and cheeks once or twice daily for 6weeks.  Recheck at that time unless the facial rash is cleared in which case she may cancel.Biopsy, Surgery (Curettage) & Surgery (Excision) Aftercare Instructions  1. Okay to remove bandage in 24 hours  2. Wash area with soap and water  3. Apply Vaseline to area twice daily until healed (Not Neosporin)  4. Okay to cover with a Band-Aid to decrease the chance of infection or prevent irritation from clothing; also it's okay to uncover lesion at home.  5. Suture instructions: return to our office in 7-10 or 10-14 days for a nurse visit for suture removal. Variable healing with sutures, if pain or itching occurs call our office. It's okay to shower or bathe 24 hours after sutures are given.  6. The following risks may occur after a biopsy,  curettage or excision: bleeding, scarring, discoloration, recurrence, infection (redness, yellow drainage, pain or swelling).  7. For questions, concerns and results call our office at [place] before 4pm & Friday before 3pm. Biopsy results will be available in 1 week.

## 2019-11-07 ENCOUNTER — Telehealth: Payer: Self-pay | Admitting: *Deleted

## 2019-11-07 NOTE — Telephone Encounter (Signed)
Left message for patient to call us back and get pathology results. She will need 30 minute with Dr. Denna Haggard for scc on left forearm and spot on right thigh was a wart.

## 2019-11-09 ENCOUNTER — Encounter: Payer: Self-pay | Admitting: Dermatology

## 2019-11-10 NOTE — Telephone Encounter (Signed)
Calling back for results.

## 2019-11-12 NOTE — Telephone Encounter (Signed)
-----   Message from Lavonna Monarch, MD sent at 11/07/2019  6:31 AM EDT ----- Schedule surgery with Dr. Darene Lamer

## 2019-11-12 NOTE — Telephone Encounter (Signed)
Phone call to patient with her Pathology results.  Patient aware of results, appointment scheduled with Dr. Denna Haggard on 0513/2021 @ 2:30pm.

## 2019-11-25 DIAGNOSIS — Z6830 Body mass index (BMI) 30.0-30.9, adult: Secondary | ICD-10-CM | POA: Diagnosis not present

## 2019-11-25 DIAGNOSIS — M25562 Pain in left knee: Secondary | ICD-10-CM | POA: Diagnosis not present

## 2019-11-27 ENCOUNTER — Encounter: Payer: Self-pay | Admitting: Orthopaedic Surgery

## 2019-11-27 ENCOUNTER — Ambulatory Visit: Payer: Self-pay

## 2019-11-27 ENCOUNTER — Other Ambulatory Visit: Payer: Self-pay

## 2019-11-27 ENCOUNTER — Ambulatory Visit (INDEPENDENT_AMBULATORY_CARE_PROVIDER_SITE_OTHER): Payer: Medicare Other | Admitting: Orthopaedic Surgery

## 2019-11-27 VITALS — Ht 62.0 in | Wt 165.0 lb

## 2019-11-27 DIAGNOSIS — M25562 Pain in left knee: Secondary | ICD-10-CM | POA: Diagnosis not present

## 2019-11-27 DIAGNOSIS — M1712 Unilateral primary osteoarthritis, left knee: Secondary | ICD-10-CM | POA: Diagnosis not present

## 2019-11-27 MED ORDER — BUPIVACAINE HCL 0.5 % IJ SOLN
3.0000 mL | INTRAMUSCULAR | Status: AC | PRN
  Administered 2019-11-27: 3 mL via INTRA_ARTICULAR

## 2019-11-27 MED ORDER — LIDOCAINE HCL 1 % IJ SOLN
0.5000 mL | INTRAMUSCULAR | Status: AC | PRN
  Administered 2019-11-27: .5 mL

## 2019-11-27 MED ORDER — METHYLPREDNISOLONE ACETATE 40 MG/ML IJ SUSP
40.0000 mg | INTRAMUSCULAR | Status: AC | PRN
  Administered 2019-11-27: 40 mg via INTRA_ARTICULAR

## 2019-11-27 NOTE — Progress Notes (Signed)
Office Visit Note   Patient: Alisha Murphy           Date of Birth: 08/03/1934           MRN: [id number] Visit Date: 11/27/2019              Requested by: Neale Burly, MD [place],   [id number] PCP: Neale Burly, MD   Assessment & Plan: Visit Diagnoses:  1. Acute pain of left knee   2. Primary osteoarthritis of left knee     Plan: Left knee injection performed hopefully she will get better and can continue ambulating with a walker.  If not she will return.  Follow-Up Instructions: Return if symptoms worsen or fail to improve.   Orders:  Orders Placed This Encounter  Procedures  . Large Joint Inj  . XR KNEE 3 VIEW LEFT   No orders of the defined types were placed in this encounter.     Procedures: Large Joint Inj: L knee on 11/27/2019 1:36 PM Indications: joint swelling and pain Details: 22 G 1.5 in needle, anterolateral approach  Arthrogram: No  Medications: 0.5 mL lidocaine 1 %; 3 mL bupivacaine 0.5 %; 40 mg methylPREDNISolone acetate 40 MG/ML Outcome: tolerated well, no immediate complications Procedure, treatment alternatives, risks and benefits explained, specific risks discussed. Consent was given by the patient. Immediately prior to procedure a time out was called to verify the correct patient, procedure, equipment, support staff and site/side marked as required. Patient was prepped and draped in the usual sterile fashion.       Clinical Data: No additional findings.   Subjective: Chief Complaint  Patient presents with  . Left Knee - Pain    HPI 85 year old female with bilateral knee osteoarthritis with bone-on-bone changes recent pop in her left knee and now with great difficulty walking.  She has a walker that she has had to use.  She went down the hall in a wheelchair.  She said some swelling in her knee previous total hip arthroplasty right and left.  History of a trochanteric bursal injection with large subcutaneous  abscess of her right hip but she did not require prosthetic removal.  Patient had bilateral total hip arthroplasties.  She also had a stroke and was then [facility] for a month. No history of diabetes. Review of Systems   Objective: Vital Signs: Ht 5\' 2"  (1.575 m)   Wt 165 lb (74.8 kg)   BMI 30.18 kg/m   Physical Exam Constitutional:      Appearance: She is well-developed.  HENT:     Head: Normocephalic.     Right Ear: External ear normal.     Left Ear: External ear normal.  Eyes:     Pupils: Pupils are equal, round, and reactive to light.  Neck:     Thyroid: No thyromegaly.     Trachea: No tracheal deviation.  Cardiovascular:     Rate and Rhythm: Normal rate.  Pulmonary:     Effort: Pulmonary effort is normal.  Abdominal:     Palpations: Abdomen is soft.  Skin:    General: Skin is warm and dry.  Neurological:     Mental Status: She is alert and oriented to person, place, and time.  Psychiatric:        Behavior: Behavior normal.     Ortho Exam patient has pain with left knee range of motion is 2+ effusion medial lateral joint line tenderness pain with patellofemoral loading.  No pitting edema no pain with hip log roll.  Specialty Comments:  No specialty comments available.  Imaging: XR KNEE 3 VIEW LEFT  Result Date: 11/27/2019 Standing AP both knees lateral left knee and sunrise patellar x-ray demonstrate tricompartmental degenerative changes bone-on-bone changes marginal osteophytes subchondral sclerosis.  Most severe in the lateral and patellofemoral compartment.  Negative for acute fracture.  Some calcification in the popliteal artery noted. Impression: Left knee end-stage osteoarthritis    PMFS History: Patient Active Problem List   Diagnosis Date Noted  . Osteoarthritis of left knee 11/27/2019  . Lumbar radiculopathy 06/16/2019  . History of acute renal failure 06/16/2019  . Left leg pain 06/16/2019  . History of ischemic stroke 06/16/2019  . GERD  04/26/2010  . COLITIS 04/26/2010  . ABDOMINAL PAIN, UNSPECIFIED SITE 04/26/2010  . PUD, HX OF 04/26/2010   Past Medical History:  Diagnosis Date  . Atrial fibrillation ([place])   . Basal cell carcinoma 08/03/2015   Right neck-(CX35FU&exc)  . Basal cell carcinoma 03/13/2016   nod-Right neck-txpbx  . Cataract   . GERD (gastroesophageal reflux disease)   . Joint pain   . SCCA (squamous cell carcinoma) of skin 11/05/2019   Left Forearem Posterior(in situ)  . 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 Stroke [facility])     Family History  Problem Relation Age of Onset  . Cancer Mother   . Heart Problems Father     Past Surgical History:  Procedure Laterality Date  . CATARACT EXTRACTION    . HIP SURGERY    . HYSTERECTOMY ABDOMINAL WITH SALPINGECTOMY     Social History   Occupational History  . Not on file  Tobacco Use  . Smoking status: Never Smoker  . Smokeless tobacco: Never Used  Substance and Sexual Activity  . Alcohol use: Not Currently  . Drug use: Never  . Sexual activity: Not on file

## 2019-11-28 ENCOUNTER — Ambulatory Visit: Payer: Self-pay

## 2019-11-28 ENCOUNTER — Other Ambulatory Visit: Payer: Self-pay

## 2019-11-28 ENCOUNTER — Ambulatory Visit: Payer: Medicare Other | Admitting: Surgical

## 2019-11-28 DIAGNOSIS — M1712 Unilateral primary osteoarthritis, left knee: Secondary | ICD-10-CM

## 2019-11-28 DIAGNOSIS — M79605 Pain in left leg: Secondary | ICD-10-CM | POA: Diagnosis not present

## 2019-11-29 ENCOUNTER — Encounter: Payer: Self-pay | Admitting: Surgical

## 2019-11-29 NOTE — Progress Notes (Signed)
Office Visit Note   Patient: Alisha Murphy           Date of Birth: 1934-08-30           MRN: [id number] Visit Date: 11/28/2019 Requested by: Neale Burly, MD [place],  [place] [id number] PCP: Neale Burly, MD  Subjective: Chief Complaint  Patient presents with  . Left Leg - Pain    HPI: Alisha Murphy is a 85 y.o. female who presents to the office complaining of left leg pain.  She complains of mild anterior left shin pain for several months with significantly worse pain in the last week.  She localizes pain to the proximal anterior aspect of the tibia.  She denies any injury or fall.  She was seen by Dr. Lorin Mercy in [facility] yesterday.  Radiographs taken of her left knee revealed moderate to severe left knee osteoarthritis.  She had a left knee injection by Dr. Lorin Mercy that provided about 15 minutes of relief to her knee and to her calf before she had recurrence of pain while ambulating.  Patient is typically worse with ambulation.  She does have a history of taking vitamin D supplement.  She denies any groin pain, numbness/tingling, radicular pain.                ROS:  All systems reviewed are negative as they relate to the chief complaint within the history of present illness.  Patient denies fevers or chills.  Assessment & Plan: Visit Diagnoses:  1. Unilateral primary osteoarthritis, left knee   2. Pain in left leg     Plan: Patient is an 85 year old female presents complaining of left shin pain.  Her main complaint is anterior tibial pain that is worse in the last week.  She is tender to palpation over the anterior tibia, about one third of the way down the shaft.  She has no such tenderness on the contralateral leg.  She does have an area of associated swelling just around this area of point tenderness.  Radiographs taken today of the tib/fib reveal no fractures.  Differential diagnosis includes knee osteoarthritis with referred pain down the calf versus tibial stress  reaction.  She did have several minutes of relief from the injection yesterday but no lasting improvement in pain today.  This may be due to delayed response from injection.  There is some concern with the point tenderness over the tibial shaft with surrounding soft tissue swelling that is not present on the contralateral leg.  Recommend that patient monitor her symptoms over the weekend and if pain is no better on Monday, schedule a follow-up appointment with Dr. Lorin Mercy.  Patient agreed with plan.  Follow-Up Instructions: No follow-ups on file.   Orders:  Orders Placed This Encounter  Procedures  . XR Tibia/Fibula Left   No orders of the defined types were placed in this encounter.     Procedures: No procedures performed   Clinical Data: No additional findings.  Objective: Vital Signs: There were no vitals taken for this visit.  Physical Exam:  Constitutional: Patient appears well-developed HEENT:  Head: Normocephalic Eyes:EOM are normal Neck: Normal range of motion Cardiovascular: Normal rate Pulmonary/chest: Effort normal Neurologic: Patient is alert Skin: Skin is warm Psychiatric: Patient has normal mood and affect  Ortho Exam:  Left knee Exam Mild tenderness to palpation over the medial lateral joint lines.  Moderate to severe tenderness to palpation over the anterior tibial shaft, about one third  of the way down from the knee.  Soft tissue swelling surrounding the area of most tenderness. No effusion Small Baker's cyst Extensor mechanism intact No tenderness to palpation over quad tendon, patellar tendon, pes anserinus, patella, tibial tubercle, LCL/MCL insertions Stable to varus/valgus stresses.  Stable to anterior/posterior drawer Extension to 0 degrees Flexion > 90 degrees  Specialty Comments:  No specialty comments available.  Imaging: No results found.   PMFS History: Patient Active Problem List   Diagnosis Date Noted  . Osteoarthritis of left knee  11/27/2019  . Lumbar radiculopathy 06/16/2019  . History of acute renal failure 06/16/2019  . Left leg pain 06/16/2019  . History of ischemic stroke 06/16/2019  . GERD 04/26/2010  . COLITIS 04/26/2010  . ABDOMINAL PAIN, UNSPECIFIED SITE 04/26/2010  . PUD, HX OF 04/26/2010   Past Medical History:  Diagnosis Date  . Atrial fibrillation ([place])   . Basal cell carcinoma 08/03/2015   Right neck-(CX35FU&exc)  . Basal cell carcinoma 03/13/2016   nod-Right neck-txpbx  . Cataract   . GERD (gastroesophageal reflux disease)   . Joint pain   . SCCA (squamous cell carcinoma) of skin 11/05/2019   Left Forearem Posterior(in situ)  . 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 Stroke [facility])     Family History  Problem Relation Age of Onset  . Cancer Mother   . Heart Problems Father     Past Surgical History:  Procedure Laterality Date  . CATARACT EXTRACTION    . HIP SURGERY    . HYSTERECTOMY ABDOMINAL WITH SALPINGECTOMY     Social History   Occupational History  . Not on file  Tobacco Use  . Smoking status: Never Smoker  . Smokeless tobacco: Never Used  Substance and Sexual Activity  . Alcohol use: Not Currently  . Drug use: Never  . Sexual activity: Not on file

## 2019-12-04 ENCOUNTER — Ambulatory Visit (INDEPENDENT_AMBULATORY_CARE_PROVIDER_SITE_OTHER): Payer: Medicare Other | Admitting: Dermatology

## 2019-12-04 ENCOUNTER — Other Ambulatory Visit: Payer: Self-pay

## 2019-12-04 ENCOUNTER — Encounter: Payer: Self-pay | Admitting: Dermatology

## 2019-12-04 DIAGNOSIS — D099 Carcinoma in situ, unspecified: Secondary | ICD-10-CM

## 2019-12-04 DIAGNOSIS — D0462 Carcinoma in situ of skin of left upper limb, including shoulder: Secondary | ICD-10-CM

## 2019-12-08 NOTE — Progress Notes (Addendum)
   Follow-Up Visit   Subjective  JONECIA SARAO is a 85 y.o. female who presents for the following: Procedure (CIS LEFT FOREARM).  CIS Location: Left forearm Duration:  Quality:  Associated Signs/Symptoms: Modifying Factors:  Severity:  Timing: Context: For treatment  The following portions of the chart were reviewed this encounter and updated as appropriate: Tobacco  Allergies  Problems  Med Hx  Surg Hx  Fam Hx      Objective  Well appearing patient in no apparent distress; mood and affect are within normal limits.  A focused examination was performed including face and arms examined. Relevant physical exam findings are noted in the Assessment and Plan.   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  Destruction of lesion Complexity: simple   Destruction method: electrodesiccation and curettage   Informed consent: discussed and consent obtained   Timeout:  patient name, date of birth, surgical site, and procedure verified Anesthesia: the lesion was anesthetized in a standard fashion   Anesthetic:  1% lidocaine w/ epinephrine 1-100,000 local infiltration Curettage performed in three different directions: Yes   Curettage cycles:  3 Lesion length (cm):  1.4 Lesion width (cm):  1 Margin per side (cm):  0 Final wound size (cm):  1.4 Hemostasis achieved with:  ferric subsulfate Outcome: patient tolerated procedure well with no complications   Post-procedure details: wound care instructions given   Additional details:  Inoculated with parenteral 5% fluorouracil

## 2019-12-15 ENCOUNTER — Telehealth: Payer: Self-pay | Admitting: Dermatology

## 2019-12-15 NOTE — Telephone Encounter (Signed)
Phone call from patient requesting a 1500 form with the charges from 11/05/19 date of service be mailed to her home address for a Cancer policy. Form printed and mailed to patients home pre her request.

## 2019-12-16 DIAGNOSIS — Z6829 Body mass index (BMI) 29.0-29.9, adult: Secondary | ICD-10-CM | POA: Diagnosis not present

## 2019-12-16 DIAGNOSIS — M25462 Effusion, left knee: Secondary | ICD-10-CM | POA: Diagnosis not present

## 2019-12-31 DIAGNOSIS — M1712 Unilateral primary osteoarthritis, left knee: Secondary | ICD-10-CM | POA: Diagnosis not present

## 2020-01-05 DIAGNOSIS — M25562 Pain in left knee: Secondary | ICD-10-CM | POA: Diagnosis not present

## 2020-01-05 DIAGNOSIS — R0683 Snoring: Secondary | ICD-10-CM | POA: Diagnosis not present

## 2020-01-05 DIAGNOSIS — Z01818 Encounter for other preprocedural examination: Secondary | ICD-10-CM | POA: Diagnosis not present

## 2020-01-05 DIAGNOSIS — R29818 Other symptoms and signs involving the nervous system: Secondary | ICD-10-CM | POA: Diagnosis not present

## 2020-01-14 DIAGNOSIS — Z01818 Encounter for other preprocedural examination: Secondary | ICD-10-CM | POA: Diagnosis not present

## 2020-01-14 DIAGNOSIS — Z9889 Other specified postprocedural states: Secondary | ICD-10-CM | POA: Diagnosis not present

## 2020-01-14 DIAGNOSIS — M1712 Unilateral primary osteoarthritis, left knee: Secondary | ICD-10-CM | POA: Diagnosis not present

## 2020-01-14 DIAGNOSIS — Z8673 Personal history of transient ischemic attack (TIA), and cerebral infarction without residual deficits: Secondary | ICD-10-CM | POA: Diagnosis not present

## 2020-01-16 DIAGNOSIS — R0683 Snoring: Secondary | ICD-10-CM | POA: Diagnosis not present

## 2020-01-22 DIAGNOSIS — R0683 Snoring: Secondary | ICD-10-CM | POA: Diagnosis not present

## 2020-01-27 DIAGNOSIS — J449 Chronic obstructive pulmonary disease, unspecified: Secondary | ICD-10-CM | POA: Diagnosis not present

## 2020-01-27 DIAGNOSIS — I10 Essential (primary) hypertension: Secondary | ICD-10-CM | POA: Diagnosis not present

## 2020-01-27 DIAGNOSIS — Z6829 Body mass index (BMI) 29.0-29.9, adult: Secondary | ICD-10-CM | POA: Diagnosis not present

## 2020-01-27 DIAGNOSIS — M25462 Effusion, left knee: Secondary | ICD-10-CM | POA: Diagnosis not present

## 2020-02-17 DIAGNOSIS — G8918 Other acute postprocedural pain: Secondary | ICD-10-CM | POA: Diagnosis not present

## 2020-02-17 DIAGNOSIS — Z471 Aftercare following joint replacement surgery: Secondary | ICD-10-CM | POA: Diagnosis not present

## 2020-02-17 DIAGNOSIS — M1712 Unilateral primary osteoarthritis, left knee: Secondary | ICD-10-CM | POA: Diagnosis not present

## 2020-02-17 DIAGNOSIS — Z96652 Presence of left artificial knee joint: Secondary | ICD-10-CM | POA: Diagnosis not present

## 2020-02-20 DIAGNOSIS — Z471 Aftercare following joint replacement surgery: Secondary | ICD-10-CM | POA: Diagnosis not present

## 2020-02-20 DIAGNOSIS — R2689 Other abnormalities of gait and mobility: Secondary | ICD-10-CM | POA: Diagnosis not present

## 2020-02-20 DIAGNOSIS — M25662 Stiffness of left knee, not elsewhere classified: Secondary | ICD-10-CM | POA: Diagnosis not present

## 2020-02-20 DIAGNOSIS — Z96652 Presence of left artificial knee joint: Secondary | ICD-10-CM | POA: Diagnosis not present

## 2020-02-23 DIAGNOSIS — M25662 Stiffness of left knee, not elsewhere classified: Secondary | ICD-10-CM | POA: Diagnosis not present

## 2020-02-23 DIAGNOSIS — R2689 Other abnormalities of gait and mobility: Secondary | ICD-10-CM | POA: Diagnosis not present

## 2020-02-23 DIAGNOSIS — Z96652 Presence of left artificial knee joint: Secondary | ICD-10-CM | POA: Diagnosis not present

## 2020-02-23 DIAGNOSIS — Z471 Aftercare following joint replacement surgery: Secondary | ICD-10-CM | POA: Diagnosis not present

## 2020-02-25 DIAGNOSIS — Z471 Aftercare following joint replacement surgery: Secondary | ICD-10-CM | POA: Diagnosis not present

## 2020-02-25 DIAGNOSIS — Z96652 Presence of left artificial knee joint: Secondary | ICD-10-CM | POA: Diagnosis not present

## 2020-02-25 DIAGNOSIS — R2689 Other abnormalities of gait and mobility: Secondary | ICD-10-CM | POA: Diagnosis not present

## 2020-02-25 DIAGNOSIS — M25662 Stiffness of left knee, not elsewhere classified: Secondary | ICD-10-CM | POA: Diagnosis not present

## 2020-02-27 DIAGNOSIS — M25662 Stiffness of left knee, not elsewhere classified: Secondary | ICD-10-CM | POA: Diagnosis not present

## 2020-02-27 DIAGNOSIS — R2689 Other abnormalities of gait and mobility: Secondary | ICD-10-CM | POA: Diagnosis not present

## 2020-02-27 DIAGNOSIS — Z471 Aftercare following joint replacement surgery: Secondary | ICD-10-CM | POA: Diagnosis not present

## 2020-02-27 DIAGNOSIS — Z96652 Presence of left artificial knee joint: Secondary | ICD-10-CM | POA: Diagnosis not present

## 2020-03-01 DIAGNOSIS — M25662 Stiffness of left knee, not elsewhere classified: Secondary | ICD-10-CM | POA: Diagnosis not present

## 2020-03-01 DIAGNOSIS — Z471 Aftercare following joint replacement surgery: Secondary | ICD-10-CM | POA: Diagnosis not present

## 2020-03-01 DIAGNOSIS — Z96652 Presence of left artificial knee joint: Secondary | ICD-10-CM | POA: Diagnosis not present

## 2020-03-01 DIAGNOSIS — R2689 Other abnormalities of gait and mobility: Secondary | ICD-10-CM | POA: Diagnosis not present

## 2020-03-03 DIAGNOSIS — Z96652 Presence of left artificial knee joint: Secondary | ICD-10-CM | POA: Diagnosis not present

## 2020-03-03 DIAGNOSIS — R2689 Other abnormalities of gait and mobility: Secondary | ICD-10-CM | POA: Diagnosis not present

## 2020-03-03 DIAGNOSIS — M25662 Stiffness of left knee, not elsewhere classified: Secondary | ICD-10-CM | POA: Diagnosis not present

## 2020-03-03 DIAGNOSIS — Z471 Aftercare following joint replacement surgery: Secondary | ICD-10-CM | POA: Diagnosis not present

## 2020-03-04 DIAGNOSIS — Z96652 Presence of left artificial knee joint: Secondary | ICD-10-CM | POA: Diagnosis not present

## 2020-03-04 DIAGNOSIS — Z471 Aftercare following joint replacement surgery: Secondary | ICD-10-CM | POA: Diagnosis not present

## 2020-03-05 DIAGNOSIS — M25662 Stiffness of left knee, not elsewhere classified: Secondary | ICD-10-CM | POA: Diagnosis not present

## 2020-03-05 DIAGNOSIS — R2689 Other abnormalities of gait and mobility: Secondary | ICD-10-CM | POA: Diagnosis not present

## 2020-03-05 DIAGNOSIS — Z96652 Presence of left artificial knee joint: Secondary | ICD-10-CM | POA: Diagnosis not present

## 2020-03-05 DIAGNOSIS — Z471 Aftercare following joint replacement surgery: Secondary | ICD-10-CM | POA: Diagnosis not present

## 2020-03-08 DIAGNOSIS — Z96652 Presence of left artificial knee joint: Secondary | ICD-10-CM | POA: Diagnosis not present

## 2020-03-08 DIAGNOSIS — R2689 Other abnormalities of gait and mobility: Secondary | ICD-10-CM | POA: Diagnosis not present

## 2020-03-08 DIAGNOSIS — M25662 Stiffness of left knee, not elsewhere classified: Secondary | ICD-10-CM | POA: Diagnosis not present

## 2020-03-08 DIAGNOSIS — Z471 Aftercare following joint replacement surgery: Secondary | ICD-10-CM | POA: Diagnosis not present

## 2020-03-10 DIAGNOSIS — M25662 Stiffness of left knee, not elsewhere classified: Secondary | ICD-10-CM | POA: Diagnosis not present

## 2020-03-10 DIAGNOSIS — Z471 Aftercare following joint replacement surgery: Secondary | ICD-10-CM | POA: Diagnosis not present

## 2020-03-10 DIAGNOSIS — Z96652 Presence of left artificial knee joint: Secondary | ICD-10-CM | POA: Diagnosis not present

## 2020-03-10 DIAGNOSIS — R2689 Other abnormalities of gait and mobility: Secondary | ICD-10-CM | POA: Diagnosis not present

## 2020-03-12 DIAGNOSIS — M25662 Stiffness of left knee, not elsewhere classified: Secondary | ICD-10-CM | POA: Diagnosis not present

## 2020-03-12 DIAGNOSIS — Z471 Aftercare following joint replacement surgery: Secondary | ICD-10-CM | POA: Diagnosis not present

## 2020-03-12 DIAGNOSIS — R2689 Other abnormalities of gait and mobility: Secondary | ICD-10-CM | POA: Diagnosis not present

## 2020-03-12 DIAGNOSIS — Z96652 Presence of left artificial knee joint: Secondary | ICD-10-CM | POA: Diagnosis not present

## 2020-03-15 DIAGNOSIS — Z96652 Presence of left artificial knee joint: Secondary | ICD-10-CM | POA: Diagnosis not present

## 2020-03-15 DIAGNOSIS — M25662 Stiffness of left knee, not elsewhere classified: Secondary | ICD-10-CM | POA: Diagnosis not present

## 2020-03-15 DIAGNOSIS — Z471 Aftercare following joint replacement surgery: Secondary | ICD-10-CM | POA: Diagnosis not present

## 2020-03-15 DIAGNOSIS — R2689 Other abnormalities of gait and mobility: Secondary | ICD-10-CM | POA: Diagnosis not present

## 2020-03-17 DIAGNOSIS — Z471 Aftercare following joint replacement surgery: Secondary | ICD-10-CM | POA: Diagnosis not present

## 2020-03-17 DIAGNOSIS — R2689 Other abnormalities of gait and mobility: Secondary | ICD-10-CM | POA: Diagnosis not present

## 2020-03-17 DIAGNOSIS — M25662 Stiffness of left knee, not elsewhere classified: Secondary | ICD-10-CM | POA: Diagnosis not present

## 2020-03-17 DIAGNOSIS — Z96652 Presence of left artificial knee joint: Secondary | ICD-10-CM | POA: Diagnosis not present

## 2020-03-19 DIAGNOSIS — M25662 Stiffness of left knee, not elsewhere classified: Secondary | ICD-10-CM | POA: Diagnosis not present

## 2020-03-19 DIAGNOSIS — R2689 Other abnormalities of gait and mobility: Secondary | ICD-10-CM | POA: Diagnosis not present

## 2020-03-19 DIAGNOSIS — Z471 Aftercare following joint replacement surgery: Secondary | ICD-10-CM | POA: Diagnosis not present

## 2020-03-19 DIAGNOSIS — Z96652 Presence of left artificial knee joint: Secondary | ICD-10-CM | POA: Diagnosis not present

## 2020-03-22 DIAGNOSIS — R2689 Other abnormalities of gait and mobility: Secondary | ICD-10-CM | POA: Diagnosis not present

## 2020-03-22 DIAGNOSIS — M25662 Stiffness of left knee, not elsewhere classified: Secondary | ICD-10-CM | POA: Diagnosis not present

## 2020-03-22 DIAGNOSIS — K2970 Gastritis, unspecified, without bleeding: Secondary | ICD-10-CM | POA: Diagnosis not present

## 2020-03-22 DIAGNOSIS — Z471 Aftercare following joint replacement surgery: Secondary | ICD-10-CM | POA: Diagnosis not present

## 2020-03-22 DIAGNOSIS — Z6829 Body mass index (BMI) 29.0-29.9, adult: Secondary | ICD-10-CM | POA: Diagnosis not present

## 2020-03-22 DIAGNOSIS — Z96652 Presence of left artificial knee joint: Secondary | ICD-10-CM | POA: Diagnosis not present

## 2020-03-24 DIAGNOSIS — Z96652 Presence of left artificial knee joint: Secondary | ICD-10-CM | POA: Diagnosis not present

## 2020-03-24 DIAGNOSIS — Z471 Aftercare following joint replacement surgery: Secondary | ICD-10-CM | POA: Diagnosis not present

## 2020-03-24 DIAGNOSIS — R2689 Other abnormalities of gait and mobility: Secondary | ICD-10-CM | POA: Diagnosis not present

## 2020-03-24 DIAGNOSIS — M25662 Stiffness of left knee, not elsewhere classified: Secondary | ICD-10-CM | POA: Diagnosis not present

## 2020-03-26 DIAGNOSIS — M25662 Stiffness of left knee, not elsewhere classified: Secondary | ICD-10-CM | POA: Diagnosis not present

## 2020-03-26 DIAGNOSIS — R2689 Other abnormalities of gait and mobility: Secondary | ICD-10-CM | POA: Diagnosis not present

## 2020-03-26 DIAGNOSIS — Z471 Aftercare following joint replacement surgery: Secondary | ICD-10-CM | POA: Diagnosis not present

## 2020-03-26 DIAGNOSIS — Z96652 Presence of left artificial knee joint: Secondary | ICD-10-CM | POA: Diagnosis not present

## 2020-03-30 DIAGNOSIS — M25662 Stiffness of left knee, not elsewhere classified: Secondary | ICD-10-CM | POA: Diagnosis not present

## 2020-03-30 DIAGNOSIS — R2689 Other abnormalities of gait and mobility: Secondary | ICD-10-CM | POA: Diagnosis not present

## 2020-03-30 DIAGNOSIS — Z471 Aftercare following joint replacement surgery: Secondary | ICD-10-CM | POA: Diagnosis not present

## 2020-03-30 DIAGNOSIS — Z96652 Presence of left artificial knee joint: Secondary | ICD-10-CM | POA: Diagnosis not present

## 2020-03-31 DIAGNOSIS — M25462 Effusion, left knee: Secondary | ICD-10-CM | POA: Diagnosis not present

## 2020-03-31 DIAGNOSIS — Z471 Aftercare following joint replacement surgery: Secondary | ICD-10-CM | POA: Diagnosis not present

## 2020-03-31 DIAGNOSIS — Z96652 Presence of left artificial knee joint: Secondary | ICD-10-CM | POA: Diagnosis not present

## 2020-04-01 DIAGNOSIS — M25662 Stiffness of left knee, not elsewhere classified: Secondary | ICD-10-CM | POA: Diagnosis not present

## 2020-04-01 DIAGNOSIS — Z471 Aftercare following joint replacement surgery: Secondary | ICD-10-CM | POA: Diagnosis not present

## 2020-04-01 DIAGNOSIS — R2689 Other abnormalities of gait and mobility: Secondary | ICD-10-CM | POA: Diagnosis not present

## 2020-04-01 DIAGNOSIS — Z96652 Presence of left artificial knee joint: Secondary | ICD-10-CM | POA: Diagnosis not present

## 2020-04-06 DIAGNOSIS — R2689 Other abnormalities of gait and mobility: Secondary | ICD-10-CM | POA: Diagnosis not present

## 2020-04-06 DIAGNOSIS — Z96652 Presence of left artificial knee joint: Secondary | ICD-10-CM | POA: Diagnosis not present

## 2020-04-06 DIAGNOSIS — M25662 Stiffness of left knee, not elsewhere classified: Secondary | ICD-10-CM | POA: Diagnosis not present

## 2020-04-06 DIAGNOSIS — Z471 Aftercare following joint replacement surgery: Secondary | ICD-10-CM | POA: Diagnosis not present

## 2020-04-08 DIAGNOSIS — Z96652 Presence of left artificial knee joint: Secondary | ICD-10-CM | POA: Diagnosis not present

## 2020-04-08 DIAGNOSIS — M25662 Stiffness of left knee, not elsewhere classified: Secondary | ICD-10-CM | POA: Diagnosis not present

## 2020-04-08 DIAGNOSIS — Z471 Aftercare following joint replacement surgery: Secondary | ICD-10-CM | POA: Diagnosis not present

## 2020-04-08 DIAGNOSIS — R2689 Other abnormalities of gait and mobility: Secondary | ICD-10-CM | POA: Diagnosis not present

## 2020-04-12 DIAGNOSIS — Z471 Aftercare following joint replacement surgery: Secondary | ICD-10-CM | POA: Diagnosis not present

## 2020-04-12 DIAGNOSIS — M25662 Stiffness of left knee, not elsewhere classified: Secondary | ICD-10-CM | POA: Diagnosis not present

## 2020-04-12 DIAGNOSIS — Z96652 Presence of left artificial knee joint: Secondary | ICD-10-CM | POA: Diagnosis not present

## 2020-04-12 DIAGNOSIS — R2689 Other abnormalities of gait and mobility: Secondary | ICD-10-CM | POA: Diagnosis not present

## 2020-04-15 DIAGNOSIS — Z471 Aftercare following joint replacement surgery: Secondary | ICD-10-CM | POA: Diagnosis not present

## 2020-04-15 DIAGNOSIS — Z96652 Presence of left artificial knee joint: Secondary | ICD-10-CM | POA: Diagnosis not present

## 2020-04-15 DIAGNOSIS — M25662 Stiffness of left knee, not elsewhere classified: Secondary | ICD-10-CM | POA: Diagnosis not present

## 2020-04-15 DIAGNOSIS — R2689 Other abnormalities of gait and mobility: Secondary | ICD-10-CM | POA: Diagnosis not present

## 2020-04-19 DIAGNOSIS — R2689 Other abnormalities of gait and mobility: Secondary | ICD-10-CM | POA: Diagnosis not present

## 2020-04-19 DIAGNOSIS — Z96652 Presence of left artificial knee joint: Secondary | ICD-10-CM | POA: Diagnosis not present

## 2020-04-19 DIAGNOSIS — Z471 Aftercare following joint replacement surgery: Secondary | ICD-10-CM | POA: Diagnosis not present

## 2020-04-19 DIAGNOSIS — M25662 Stiffness of left knee, not elsewhere classified: Secondary | ICD-10-CM | POA: Diagnosis not present

## 2020-04-21 DIAGNOSIS — Z6829 Body mass index (BMI) 29.0-29.9, adult: Secondary | ICD-10-CM | POA: Diagnosis not present

## 2020-04-21 DIAGNOSIS — R600 Localized edema: Secondary | ICD-10-CM | POA: Diagnosis not present

## 2020-04-22 DIAGNOSIS — Z471 Aftercare following joint replacement surgery: Secondary | ICD-10-CM | POA: Diagnosis not present

## 2020-04-22 DIAGNOSIS — R2689 Other abnormalities of gait and mobility: Secondary | ICD-10-CM | POA: Diagnosis not present

## 2020-04-22 DIAGNOSIS — M25662 Stiffness of left knee, not elsewhere classified: Secondary | ICD-10-CM | POA: Diagnosis not present

## 2020-04-22 DIAGNOSIS — Z96652 Presence of left artificial knee joint: Secondary | ICD-10-CM | POA: Diagnosis not present

## 2020-04-26 DIAGNOSIS — M25662 Stiffness of left knee, not elsewhere classified: Secondary | ICD-10-CM | POA: Diagnosis not present

## 2020-04-26 DIAGNOSIS — Z471 Aftercare following joint replacement surgery: Secondary | ICD-10-CM | POA: Diagnosis not present

## 2020-04-26 DIAGNOSIS — Z96652 Presence of left artificial knee joint: Secondary | ICD-10-CM | POA: Diagnosis not present

## 2020-04-26 DIAGNOSIS — R2689 Other abnormalities of gait and mobility: Secondary | ICD-10-CM | POA: Diagnosis not present

## 2020-04-29 DIAGNOSIS — R2689 Other abnormalities of gait and mobility: Secondary | ICD-10-CM | POA: Diagnosis not present

## 2020-04-29 DIAGNOSIS — Z96652 Presence of left artificial knee joint: Secondary | ICD-10-CM | POA: Diagnosis not present

## 2020-04-29 DIAGNOSIS — M25662 Stiffness of left knee, not elsewhere classified: Secondary | ICD-10-CM | POA: Diagnosis not present

## 2020-04-29 DIAGNOSIS — Z471 Aftercare following joint replacement surgery: Secondary | ICD-10-CM | POA: Diagnosis not present

## 2020-05-03 DIAGNOSIS — Z96652 Presence of left artificial knee joint: Secondary | ICD-10-CM | POA: Diagnosis not present

## 2020-05-03 DIAGNOSIS — M25662 Stiffness of left knee, not elsewhere classified: Secondary | ICD-10-CM | POA: Diagnosis not present

## 2020-05-03 DIAGNOSIS — Z471 Aftercare following joint replacement surgery: Secondary | ICD-10-CM | POA: Diagnosis not present

## 2020-05-03 DIAGNOSIS — R2689 Other abnormalities of gait and mobility: Secondary | ICD-10-CM | POA: Diagnosis not present

## 2020-05-06 DIAGNOSIS — M25662 Stiffness of left knee, not elsewhere classified: Secondary | ICD-10-CM | POA: Diagnosis not present

## 2020-05-06 DIAGNOSIS — Z96652 Presence of left artificial knee joint: Secondary | ICD-10-CM | POA: Diagnosis not present

## 2020-05-06 DIAGNOSIS — R2689 Other abnormalities of gait and mobility: Secondary | ICD-10-CM | POA: Diagnosis not present

## 2020-05-06 DIAGNOSIS — Z471 Aftercare following joint replacement surgery: Secondary | ICD-10-CM | POA: Diagnosis not present

## 2020-05-10 DIAGNOSIS — Z471 Aftercare following joint replacement surgery: Secondary | ICD-10-CM | POA: Diagnosis not present

## 2020-05-10 DIAGNOSIS — R2689 Other abnormalities of gait and mobility: Secondary | ICD-10-CM | POA: Diagnosis not present

## 2020-05-10 DIAGNOSIS — M25662 Stiffness of left knee, not elsewhere classified: Secondary | ICD-10-CM | POA: Diagnosis not present

## 2020-05-10 DIAGNOSIS — Z96652 Presence of left artificial knee joint: Secondary | ICD-10-CM | POA: Diagnosis not present

## 2020-05-13 DIAGNOSIS — Z96652 Presence of left artificial knee joint: Secondary | ICD-10-CM | POA: Diagnosis not present

## 2020-05-13 DIAGNOSIS — M25662 Stiffness of left knee, not elsewhere classified: Secondary | ICD-10-CM | POA: Diagnosis not present

## 2020-05-13 DIAGNOSIS — R2689 Other abnormalities of gait and mobility: Secondary | ICD-10-CM | POA: Diagnosis not present

## 2020-05-13 DIAGNOSIS — Z471 Aftercare following joint replacement surgery: Secondary | ICD-10-CM | POA: Diagnosis not present

## 2020-06-02 IMAGING — XA Imaging study
2 series · 2 of 2 positions shown · non-contrast
Comparison: none

CLINICAL DATA: Right lower extremity radiculopathy.

[Series 1: ortho standard · 1 of 1 slices shown (1 of 2)]
[im 1/1]
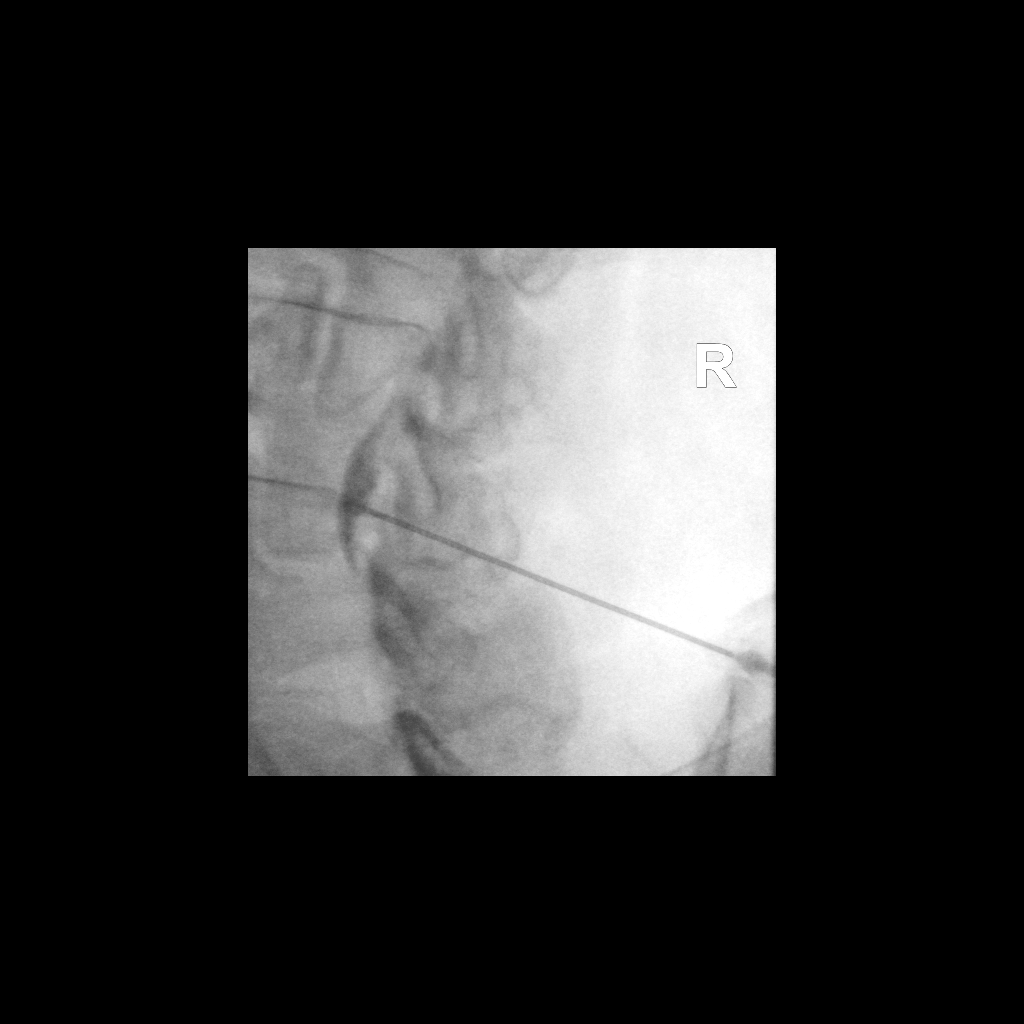

[Series 2: ortho standard · 1 of 1 slices shown (2 of 2)]
[im 1/1]
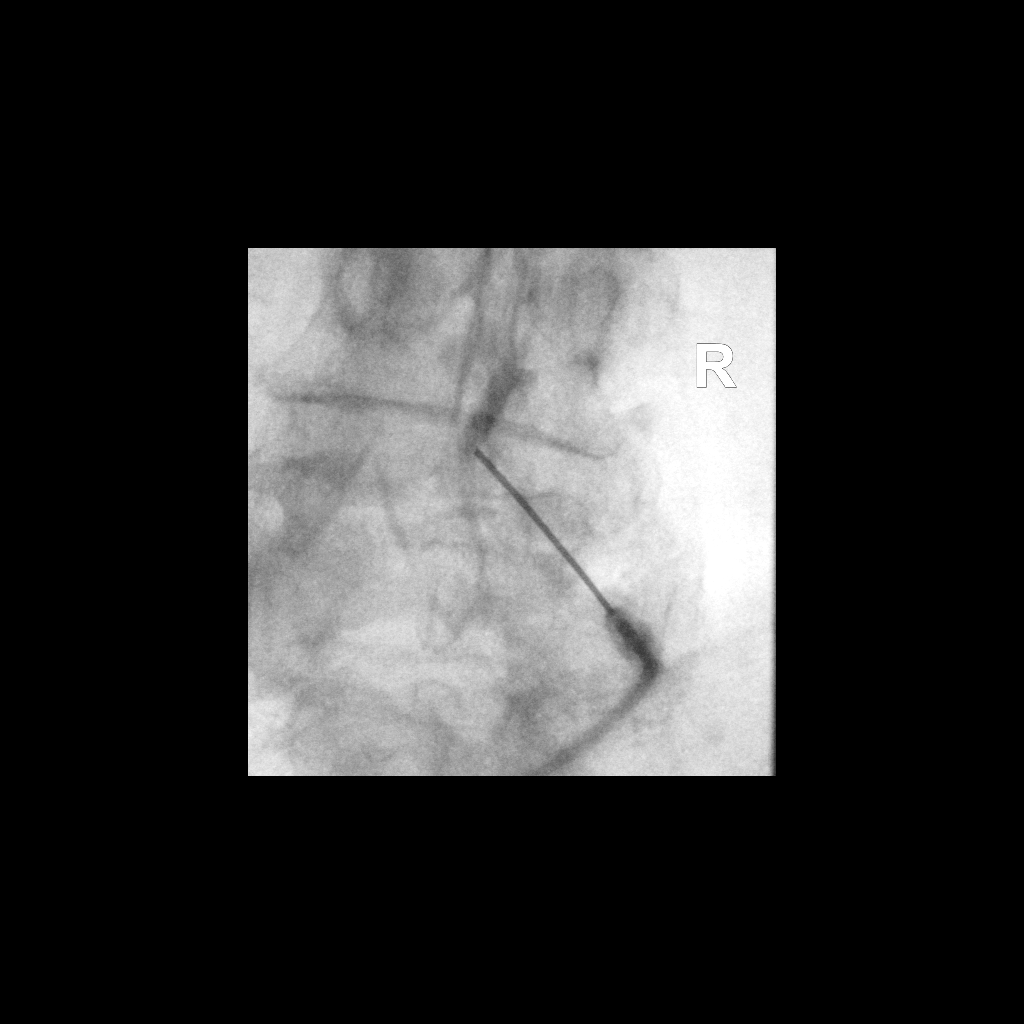

[2 of 2 positions shown; findings below may reference images not displayed]

FLUOROSCOPY TIME:  Radiation Exposure Index (as provided by the
fluoroscopic device): 16.76 uGy*m2

Fluoroscopy Time:  17 seconds

Number of Acquired Images:  0

PROCEDURE:
The procedure, risks, benefits, and alternatives were explained to
the patient. Questions regarding the procedure were encouraged and
answered. The patient understands and consents to the procedure.

LUMBAR EPIDURAL INJECTION:

An interlaminar approach was performed on right at L4-5. The
overlying skin was cleansed and anesthetized. A 20 gauge epidural
needle was advanced using loss-of-resistance technique.

DIAGNOSTIC EPIDURAL INJECTION:

Injection of Isovue-M 200 shows a good epidural pattern with spread
above and below the level of needle placement, primarily on the
right no vascular opacification is seen.

THERAPEUTIC EPIDURAL INJECTION:

120 mg of Depo-Medrol mixed with 1.5 mL 1% lidocaine were instilled.
The procedure was well-tolerated, and the patient was discharged
thirty minutes following the injection in good condition.

COMPLICATIONS:
None
IMPRESSION: Technically successful epidural injection on the right L4-5 # 1

## 2020-06-03 DIAGNOSIS — Z96642 Presence of left artificial hip joint: Secondary | ICD-10-CM | POA: Diagnosis not present

## 2020-06-03 DIAGNOSIS — M25462 Effusion, left knee: Secondary | ICD-10-CM | POA: Diagnosis not present

## 2020-06-03 DIAGNOSIS — I998 Other disorder of circulatory system: Secondary | ICD-10-CM | POA: Diagnosis not present

## 2020-06-03 DIAGNOSIS — M21162 Varus deformity, not elsewhere classified, left knee: Secondary | ICD-10-CM | POA: Diagnosis not present

## 2020-06-03 DIAGNOSIS — M461 Sacroiliitis, not elsewhere classified: Secondary | ICD-10-CM | POA: Diagnosis not present

## 2020-06-03 DIAGNOSIS — Z471 Aftercare following joint replacement surgery: Secondary | ICD-10-CM | POA: Diagnosis not present

## 2020-06-03 DIAGNOSIS — Z96652 Presence of left artificial knee joint: Secondary | ICD-10-CM | POA: Diagnosis not present

## 2020-07-06 DIAGNOSIS — M1711 Unilateral primary osteoarthritis, right knee: Secondary | ICD-10-CM | POA: Diagnosis not present

## 2020-07-06 DIAGNOSIS — M7121 Synovial cyst of popliteal space [Baker], right knee: Secondary | ICD-10-CM | POA: Diagnosis not present

## 2020-07-06 DIAGNOSIS — Z6829 Body mass index (BMI) 29.0-29.9, adult: Secondary | ICD-10-CM | POA: Diagnosis not present

## 2020-07-11 DIAGNOSIS — M5136 Other intervertebral disc degeneration, lumbar region: Secondary | ICD-10-CM | POA: Diagnosis not present

## 2020-07-11 DIAGNOSIS — S32010A Wedge compression fracture of first lumbar vertebra, initial encounter for closed fracture: Secondary | ICD-10-CM | POA: Diagnosis not present

## 2020-07-11 DIAGNOSIS — W1830XA Fall on same level, unspecified, initial encounter: Secondary | ICD-10-CM | POA: Diagnosis not present

## 2020-07-11 DIAGNOSIS — S32010D Wedge compression fracture of first lumbar vertebra, subsequent encounter for fracture with routine healing: Secondary | ICD-10-CM | POA: Diagnosis not present

## 2020-07-11 DIAGNOSIS — I4891 Unspecified atrial fibrillation: Secondary | ICD-10-CM | POA: Diagnosis not present

## 2020-07-11 DIAGNOSIS — S39012A Strain of muscle, fascia and tendon of lower back, initial encounter: Secondary | ICD-10-CM | POA: Diagnosis not present

## 2020-07-11 DIAGNOSIS — K219 Gastro-esophageal reflux disease without esophagitis: Secondary | ICD-10-CM | POA: Diagnosis not present

## 2020-07-11 DIAGNOSIS — Z043 Encounter for examination and observation following other accident: Secondary | ICD-10-CM | POA: Diagnosis not present

## 2020-07-22 ENCOUNTER — Ambulatory Visit (INDEPENDENT_AMBULATORY_CARE_PROVIDER_SITE_OTHER): Payer: Medicare Other | Admitting: Orthopaedic Surgery

## 2020-07-22 ENCOUNTER — Other Ambulatory Visit: Payer: Self-pay

## 2020-07-22 ENCOUNTER — Encounter: Payer: Self-pay | Admitting: Orthopaedic Surgery

## 2020-07-22 DIAGNOSIS — S32010A Wedge compression fracture of first lumbar vertebra, initial encounter for closed fracture: Secondary | ICD-10-CM | POA: Diagnosis not present

## 2020-07-22 DIAGNOSIS — S32000A Wedge compression fracture of unspecified lumbar vertebra, initial encounter for closed fracture: Secondary | ICD-10-CM | POA: Insufficient documentation

## 2020-07-22 MED ORDER — TRAMADOL HCL 50 MG PO TABS: 50.0000 mg | ORAL_TABLET | Freq: Two times a day (BID) | ORAL | 0 refills | Status: DC | PRN

## 2020-07-22 NOTE — Progress Notes (Addendum)
Office Visit Note   Patient: Alisha Murphy           Date of Birth: Nov 22, 1934           MRN: [id number] Visit Date: 07/22/2020              Requested by: Toma Deiters, MD [address] [place],  [place] [address] PCP: Toma Deiters, MD   Assessment & Plan: Visit Diagnoses:  1. Compression fracture of L1 vertebra, initial encounter (HCC)     Plan: Reviewed x-rays with compression fracture at L1.  Tramadol prescribed that she can take as needed for severe pain.  We discussed positioning that should help with the pain.  She should get progressive improvement with time.  We discussed avoiding bending and lifting.  Recheck 4 weeks.  AP lateral lumbar x-ray on return.  Follow-Up Instructions: No follow-ups on file.   Orders:  No orders of the defined types were placed in this encounter.  No orders of the defined types were placed in this encounter.     Procedures: No procedures performed   Clinical Data: No additional findings.   Subjective: Chief Complaint  Patient presents with  . Lower Back - Pain    Fall 07/09/2020    HPI 85 year old female seen with onset low back pain 07/09/2020.  She was seen in emergency room 07/11/2020 with excruciating left-sided back pain treated with Tylenol.  X-rays at that time showed acute L1 compression fracture.  Previous MRI 11/14/2018 showed some central disc protrusion at L3-4 but no compression fractures.  Patient had a fall from standing position the day of her injury 07/09/2020.  She denies bowel or bladder symptoms.  Pain with forward flexion is present.  She is used just Tylenol and Lidoderm patches.  Negative for loss of consciousness with her fall.  Negative dizziness.  Patient referred here by Dr.Hasanaj  Review of Systems past history of ischemic stroke acute renal failure, GERD.  Knee osteoarthritis on the left.  Previous bilateral total of arthroplasties.  All other systems noncontributory.   Objective: Vital Signs: Ht  5\' 2"  (1.575 m)   Wt 152 lb (68.9 kg)   BMI 27.80 kg/m   Physical Exam Constitutional:      Appearance: She is well-developed.  HENT:     Head: Normocephalic.     Right Ear: External ear normal.     Left Ear: External ear normal.  Eyes:     Pupils: Pupils are equal, round, and reactive to light.  Neck:     Thyroid: No thyromegaly.     Trachea: No tracheal deviation.  Cardiovascular:     Rate and Rhythm: Normal rate.  Pulmonary:     Effort: Pulmonary effort is normal.  Abdominal:     Palpations: Abdomen is soft.  Skin:    General: Skin is warm and dry.  Neurological:     Mental Status: She is alert and oriented to person, place, and time.  Psychiatric:        Mood and Affect: Mood and affect normal.        Behavior: Behavior normal.     Ortho Exam patient has some reproduced pain with resisted hip flexion quads are strong.  Pain on the left side back radiates around upper abdomen reproduced with forward flexion or extension.  Specialty Comments:  No specialty comments available.  Imaging: No results found.   PMFS History: Patient Active Problem List   Diagnosis Date Noted  .  Lumbar compression fracture ([place]) 07/22/2020  . Osteoarthritis of left knee 11/27/2019  . Lumbar radiculopathy 06/16/2019  . History of acute renal failure 06/16/2019  . Left leg pain 06/16/2019  . History of ischemic stroke 06/16/2019  . GERD 04/26/2010  . COLITIS 04/26/2010  . ABDOMINAL PAIN, UNSPECIFIED SITE 04/26/2010  . PUD, HX OF 04/26/2010   Past Medical History:  Diagnosis Date  . Atrial fibrillation ([place])   . Basal cell carcinoma 08/03/2015   Right neck-(CX35FU&exc)  . Basal cell carcinoma 03/13/2016   nod-Right neck-txpbx  . Cataract   . GERD (gastroesophageal reflux disease)   . Joint pain   . SCCA (squamous cell carcinoma) of skin 11/05/2019   Left Forearem Posterior(in situ)  . 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 Stroke  [facility])     Family History  Problem Relation Age of Onset  . Cancer Mother   . Heart Problems Father     Past Surgical History:  Procedure Laterality Date  . CATARACT EXTRACTION    . HIP SURGERY    . HYSTERECTOMY ABDOMINAL WITH SALPINGECTOMY     Social History   Occupational History  . Not on file  Tobacco Use  . Smoking status: Never Smoker  . Smokeless tobacco: Never Used  Substance and Sexual Activity  . Alcohol use: Not Currently  . Drug use: Never  . Sexual activity: Not on file

## 2020-07-28 DIAGNOSIS — S32020S Wedge compression fracture of second lumbar vertebra, sequela: Secondary | ICD-10-CM | POA: Diagnosis not present

## 2020-07-28 DIAGNOSIS — Z6829 Body mass index (BMI) 29.0-29.9, adult: Secondary | ICD-10-CM | POA: Diagnosis not present

## 2020-08-13 ENCOUNTER — Other Ambulatory Visit: Payer: Self-pay | Admitting: Orthopaedic Surgery

## 2020-08-13 ENCOUNTER — Telehealth: Payer: Self-pay

## 2020-08-13 ENCOUNTER — Telehealth: Payer: Self-pay | Admitting: Orthopaedic Surgery

## 2020-08-13 MED ORDER — TRAMADOL HCL 50 MG PO TABS: 50.0000 mg | ORAL_TABLET | Freq: Two times a day (BID) | ORAL | 0 refills | Status: AC | PRN

## 2020-08-13 NOTE — Telephone Encounter (Signed)
Duplicate message in chart.  

## 2020-08-13 NOTE — Telephone Encounter (Signed)
Pt called stating she needs a refill of her tramadol please and she would like to be notified when it's ready for pick up.   [phone]

## 2020-08-13 NOTE — Telephone Encounter (Signed)
Please advise 

## 2020-08-13 NOTE — Telephone Encounter (Signed)
Pt needs a refill on tramadol  She uses [facility]  She can be reached at [phone]

## 2020-08-13 NOTE — Telephone Encounter (Signed)
I called patient and advised. 

## 2020-08-13 NOTE — Telephone Encounter (Signed)
[facility]call  I sent to [facility] thanks

## 2020-08-15 DIAGNOSIS — R21 Rash and other nonspecific skin eruption: Secondary | ICD-10-CM | POA: Diagnosis not present

## 2020-08-15 DIAGNOSIS — Z20822 Contact with and (suspected) exposure to covid-19: Secondary | ICD-10-CM | POA: Diagnosis not present

## 2020-08-15 DIAGNOSIS — R531 Weakness: Secondary | ICD-10-CM | POA: Diagnosis not present

## 2020-08-15 DIAGNOSIS — J9811 Atelectasis: Secondary | ICD-10-CM | POA: Diagnosis not present

## 2020-08-15 DIAGNOSIS — E860 Dehydration: Secondary | ICD-10-CM | POA: Diagnosis not present

## 2020-08-15 DIAGNOSIS — Z8673 Personal history of transient ischemic attack (TIA), and cerebral infarction without residual deficits: Secondary | ICD-10-CM | POA: Diagnosis not present

## 2020-08-15 DIAGNOSIS — N3000 Acute cystitis without hematuria: Secondary | ICD-10-CM | POA: Diagnosis not present

## 2020-08-19 ENCOUNTER — Ambulatory Visit (INDEPENDENT_AMBULATORY_CARE_PROVIDER_SITE_OTHER): Payer: Medicare Other | Admitting: Orthopaedic Surgery

## 2020-08-19 ENCOUNTER — Encounter: Payer: Self-pay | Admitting: Orthopaedic Surgery

## 2020-08-19 ENCOUNTER — Ambulatory Visit: Payer: Self-pay

## 2020-08-19 ENCOUNTER — Other Ambulatory Visit: Payer: Self-pay

## 2020-08-19 VITALS — Ht 62.0 in | Wt 152.0 lb

## 2020-08-19 DIAGNOSIS — S32010A Wedge compression fracture of first lumbar vertebra, initial encounter for closed fracture: Secondary | ICD-10-CM

## 2020-08-19 DIAGNOSIS — Z6825 Body mass index (BMI) 25.0-25.9, adult: Secondary | ICD-10-CM | POA: Diagnosis not present

## 2020-08-19 DIAGNOSIS — N3001 Acute cystitis with hematuria: Secondary | ICD-10-CM | POA: Diagnosis not present

## 2020-08-19 NOTE — Progress Notes (Signed)
Office Visit Note   Patient: Alisha Murphy           Date of Birth: 05-22-1935           MRN: [id number] Visit Date: 08/19/2020              Requested by: Neale Burly, MD [place],  [place] [id number] PCP: Neale Burly, MD   Assessment & Plan: Visit Diagnoses:  1. Compression fracture of L1 vertebra, initial encounter [facility])     Plan: Patient will continue to do walk daily until she gets tired she can sit and rest and then repeat.  She is noting improvement every week but states if not improving as fast as she would like.  UTI symptoms have resolved since treatment.  She can follow-up if she is not continue to gradually improve as expected.  She or her son can call if they have further questions.  Follow-Up Instructions: No follow-ups on file.   Orders:  Orders Placed This Encounter  Procedures  . XR Lumbar Spine 2-3 Views   No orders of the defined types were placed in this encounter.     Procedures: No procedures performed   Clinical Data: No additional findings.   Subjective: Chief Complaint  Patient presents with  . Lower Back - Fracture, Follow-up    Fall 07/09/2020    HPI 86 year old female returns post fall 07/09/2020 with ER x-rays from [facility] showing L1 compression fracture.  She is noted gradual improvement over the last month she still walks with a walker.  When she is up she has increased pain she gets relief when she sits.  More pain with bending.  She returns today for repeat x-rays.  She had recent problems with dehydration with UTI and was in the emergency room for 12 hours before she was finally seen treated and is significantly improved.  Patient is here today with her son who provides additional history.  Review of Systems Unchanged other than mentioned above.  Objective: Vital Signs: Ht 5\' 2"  (1.575 m)   Wt 152 lb (68.9 kg)   BMI 27.80 kg/m   Physical Exam Constitutional:      Appearance: She is well-developed.   HENT:     Head: Normocephalic.     Right Ear: External ear normal.     Left Ear: External ear normal.  Eyes:     Pupils: Pupils are equal, round, and reactive to light.  Neck:     Thyroid: No thyromegaly.     Trachea: No tracheal deviation.  Cardiovascular:     Rate and Rhythm: Normal rate.  Pulmonary:     Effort: Pulmonary effort is normal.  Abdominal:     Palpations: Abdomen is soft.  Skin:    General: Skin is warm and dry.  Neurological:     Mental Status: She is alert and oriented to person, place, and time.  Psychiatric:        Mood and Affect: Mood and affect normal.        Behavior: Behavior normal.     Ortho Exam patient comfortable sitting position ankle dorsiflexion plantarflexion is intact.  Patient is ambulating with a walker. Specialty Comments:  No specialty comments available.  Imaging: No results found.   PMFS History: Patient Active Problem List   Diagnosis Date Noted  . Lumbar compression fracture ([place]) 07/22/2020  . Osteoarthritis of left knee 11/27/2019  . Lumbar radiculopathy 06/16/2019  . History of acute  renal failure 06/16/2019  . Left leg pain 06/16/2019  . History of ischemic stroke 06/16/2019  . GERD 04/26/2010  . COLITIS 04/26/2010  . ABDOMINAL PAIN, UNSPECIFIED SITE 04/26/2010  . PUD, HX OF 04/26/2010   Past Medical History:  Diagnosis Date  . Atrial fibrillation ([place])   . Basal cell carcinoma 08/03/2015   Right neck-(CX35FU&exc)  . Basal cell carcinoma 03/13/2016   nod-Right neck-txpbx  . Cataract   . GERD (gastroesophageal reflux disease)   . Joint pain   . SCCA (squamous cell carcinoma) of skin 11/05/2019   Left Forearem Posterior(in situ)  . 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 Stroke [facility])     Family History  Problem Relation Age of Onset  . Cancer Mother   . Heart Problems Father     Past Surgical History:  Procedure Laterality Date  . CATARACT EXTRACTION    . HIP  SURGERY    . HYSTERECTOMY ABDOMINAL WITH SALPINGECTOMY     Social History   Occupational History  . Not on file  Tobacco Use  . Smoking status: Never Smoker  . Smokeless tobacco: Never Used  Substance and Sexual Activity  . Alcohol use: Not Currently  . Drug use: Never  . Sexual activity: Not on file

## 2020-08-26 DIAGNOSIS — F411 Generalized anxiety disorder: Secondary | ICD-10-CM | POA: Diagnosis not present

## 2020-08-26 DIAGNOSIS — Z6825 Body mass index (BMI) 25.0-25.9, adult: Secondary | ICD-10-CM | POA: Diagnosis not present

## 2020-08-26 DIAGNOSIS — R0600 Dyspnea, unspecified: Secondary | ICD-10-CM | POA: Diagnosis not present

## 2020-09-22 DIAGNOSIS — Z6827 Body mass index (BMI) 27.0-27.9, adult: Secondary | ICD-10-CM | POA: Diagnosis not present

## 2020-09-22 DIAGNOSIS — M5432 Sciatica, left side: Secondary | ICD-10-CM | POA: Diagnosis not present

## 2020-09-22 DIAGNOSIS — M5414 Radiculopathy, thoracic region: Secondary | ICD-10-CM | POA: Diagnosis not present

## 2020-09-22 DIAGNOSIS — F411 Generalized anxiety disorder: Secondary | ICD-10-CM | POA: Diagnosis not present

## 2020-09-22 DIAGNOSIS — R0600 Dyspnea, unspecified: Secondary | ICD-10-CM | POA: Diagnosis not present

## 2020-09-29 DIAGNOSIS — M545 Low back pain, unspecified: Secondary | ICD-10-CM | POA: Diagnosis not present

## 2020-09-29 DIAGNOSIS — S22039A Unspecified fracture of third thoracic vertebra, initial encounter for closed fracture: Secondary | ICD-10-CM | POA: Diagnosis not present

## 2020-09-29 DIAGNOSIS — S32019A Unspecified fracture of first lumbar vertebra, initial encounter for closed fracture: Secondary | ICD-10-CM | POA: Diagnosis not present

## 2020-09-29 DIAGNOSIS — M47814 Spondylosis without myelopathy or radiculopathy, thoracic region: Secondary | ICD-10-CM | POA: Diagnosis not present

## 2020-09-29 DIAGNOSIS — S22030A Wedge compression fracture of third thoracic vertebra, initial encounter for closed fracture: Secondary | ICD-10-CM | POA: Diagnosis not present

## 2020-09-29 DIAGNOSIS — M48061 Spinal stenosis, lumbar region without neurogenic claudication: Secondary | ICD-10-CM | POA: Diagnosis not present

## 2020-09-29 DIAGNOSIS — M8588 Other specified disorders of bone density and structure, other site: Secondary | ICD-10-CM | POA: Diagnosis not present

## 2020-09-29 DIAGNOSIS — I2510 Atherosclerotic heart disease of native coronary artery without angina pectoris: Secondary | ICD-10-CM | POA: Diagnosis not present

## 2020-09-29 DIAGNOSIS — S2231XA Fracture of one rib, right side, initial encounter for closed fracture: Secondary | ICD-10-CM | POA: Diagnosis not present

## 2020-09-29 DIAGNOSIS — S2241XD Multiple fractures of ribs, right side, subsequent encounter for fracture with routine healing: Secondary | ICD-10-CM | POA: Diagnosis not present

## 2020-10-06 DIAGNOSIS — H5203 Hypermetropia, bilateral: Secondary | ICD-10-CM | POA: Diagnosis not present

## 2020-10-20 DIAGNOSIS — N3000 Acute cystitis without hematuria: Secondary | ICD-10-CM | POA: Diagnosis not present

## 2020-10-20 DIAGNOSIS — L570 Actinic keratosis: Secondary | ICD-10-CM | POA: Diagnosis not present

## 2020-10-20 DIAGNOSIS — Z6827 Body mass index (BMI) 27.0-27.9, adult: Secondary | ICD-10-CM | POA: Diagnosis not present

## 2020-11-03 ENCOUNTER — Other Ambulatory Visit: Payer: Self-pay | Admitting: Orthopaedic Surgery

## 2020-11-04 ENCOUNTER — Other Ambulatory Visit: Payer: Self-pay | Admitting: Orthopaedic Surgery

## 2020-11-04 NOTE — Telephone Encounter (Signed)
Pt was called and informed and stated understanding  

## 2020-11-04 NOTE — Telephone Encounter (Signed)
This is a narcotic and not good to keep taking , ucall, she can use tylenol for pain.

## 2020-11-25 DIAGNOSIS — Z23 Encounter for immunization: Secondary | ICD-10-CM | POA: Diagnosis not present

## 2020-12-02 ENCOUNTER — Ambulatory Visit: Payer: Self-pay

## 2020-12-02 ENCOUNTER — Ambulatory Visit (INDEPENDENT_AMBULATORY_CARE_PROVIDER_SITE_OTHER): Payer: Medicare Other | Admitting: Orthopaedic Surgery

## 2020-12-02 DIAGNOSIS — S32010A Wedge compression fracture of first lumbar vertebra, initial encounter for closed fracture: Secondary | ICD-10-CM | POA: Diagnosis not present

## 2020-12-02 NOTE — Progress Notes (Signed)
Office Visit Note   Patient: Alisha Murphy           Date of Birth: 07-05-1935           MRN: [id number] Visit Date: 12/02/2020              Requested by: Neale Burly, MD [place],  [place] [id number] PCP: Neale Burly, MD   Assessment & Plan: Visit Diagnoses:  1. Compression fracture of L1 vertebra, initial encounter [facility])     Plan: Patient asked if custom brace will take care of her problem.  I discussed with her that a longer and better fitting brace will give her pulmonary problems and if she already has COPD this is not going to help.  She can use the brace she got from [facility] which is Velcro and some aluminum posterior stays uses it intermittently.  If it turns out she could indeed consider having general anesthesia she can call let us know we can obtain an MRI scan of the lumbar spine to see if she has an area of severe stenosis that corresponds with her neurogenic claudication symptoms.  Follow-Up Instructions: No follow-ups on file.   Orders:  Orders Placed This Encounter  Procedures  . XR Lumbar Spine 2-3 Views   No orders of the defined types were placed in this encounter.     Procedures: No procedures performed   Clinical Data: No additional findings.   Subjective: Chief Complaint  Patient presents with  . Lower Back - Pain    HPI 86 year old female returns for follow-up of L1 compression fracture that occurred December 2021.  She states she cannot stand long she only gets relief when she is lying down.  She states she has COPD and cannot be put to sleep.  Past history of CVA with 1 month in the hospital.  Previous acute renal failure.  Stat bilateral hip arthroplasties left pops about 50% of the day and then stops popping.  She continues to have back pain and states she got a brace at [facility] which seems to help for stand up a little bit straighter.  States she has pain that radiates from her back radiates down her left leg she ambulates  with a cane.  She states she never smoked but has bad COPD and has to use inhalers.  She states she is now allowed to have general anesthesia.  Review of Systems review of systems updated unchanged from 07/22/2020 office visit.   Objective: Vital Signs: There were no vitals taken for this visit.  Physical Exam Constitutional:      Appearance: She is well-developed.  HENT:     Head: Normocephalic.     Right Ear: External ear normal.     Left Ear: External ear normal.  Eyes:     Pupils: Pupils are equal, round, and reactive to light.  Neck:     Thyroid: No thyromegaly.     Trachea: No tracheal deviation.  Cardiovascular:     Rate and Rhythm: Normal rate.  Pulmonary:     Effort: Pulmonary effort is normal.  Abdominal:     Palpations: Abdomen is soft.  Skin:    General: Skin is warm and dry.  Neurological:     Mental Status: She is alert and oriented to person, place, and time.  Psychiatric:        Behavior: Behavior normal.     Ortho Exam patient has some thoracolumbar kyphosis.  Quads are  strong knee reaches full extension on the left no effusion.  No pain with internal and external rotation of the hip.  Specialty Comments:  No specialty comments available.  Imaging: No results found.   PMFS History: Patient Active Problem List   Diagnosis Date Noted  . Lumbar compression fracture ([place]) 07/22/2020  . Osteoarthritis of left knee 11/27/2019  . Lumbar radiculopathy 06/16/2019  . History of acute renal failure 06/16/2019  . Left leg pain 06/16/2019  . History of ischemic stroke 06/16/2019  . GERD 04/26/2010  . COLITIS 04/26/2010  . ABDOMINAL PAIN, UNSPECIFIED SITE 04/26/2010  . PUD, HX OF 04/26/2010   Past Medical History:  Diagnosis Date  . Atrial fibrillation ([place])   . Basal cell carcinoma 08/03/2015   Right neck-(CX35FU&exc)  . Basal cell carcinoma 03/13/2016   nod-Right neck-txpbx  . Cataract   . GERD (gastroesophageal reflux disease)   . Joint pain    . SCCA (squamous cell carcinoma) of skin 11/05/2019   Left Forearem Posterior(in situ)  . 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 Stroke [facility])     Family History  Problem Relation Age of Onset  . Cancer Mother   . Heart Problems Father     Past Surgical History:  Procedure Laterality Date  . CATARACT EXTRACTION    . HIP SURGERY    . HYSTERECTOMY ABDOMINAL WITH SALPINGECTOMY     Social History   Occupational History  . Not on file  Tobacco Use  . Smoking status: Never Smoker  . Smokeless tobacco: Never Used  Substance and Sexual Activity  . Alcohol use: Not Currently  . Drug use: Never  . Sexual activity: Not on file

## 2020-12-04 IMAGING — XA Imaging study
2 series · 2 of 2 positions shown · non-contrast
Comparison: none

CLINICAL DATA: Lumbosacral spondylosis without myelopathy with
radiculopathy. New left leg pain for the past 6 weeks. Left lateral
recess stenosis at L3-L4. Left L4 nerve root block and
transforaminal injection requested.

[Series 1: ortho standard · 1 of 1 slices shown (1 of 2)]
[im 1/1]
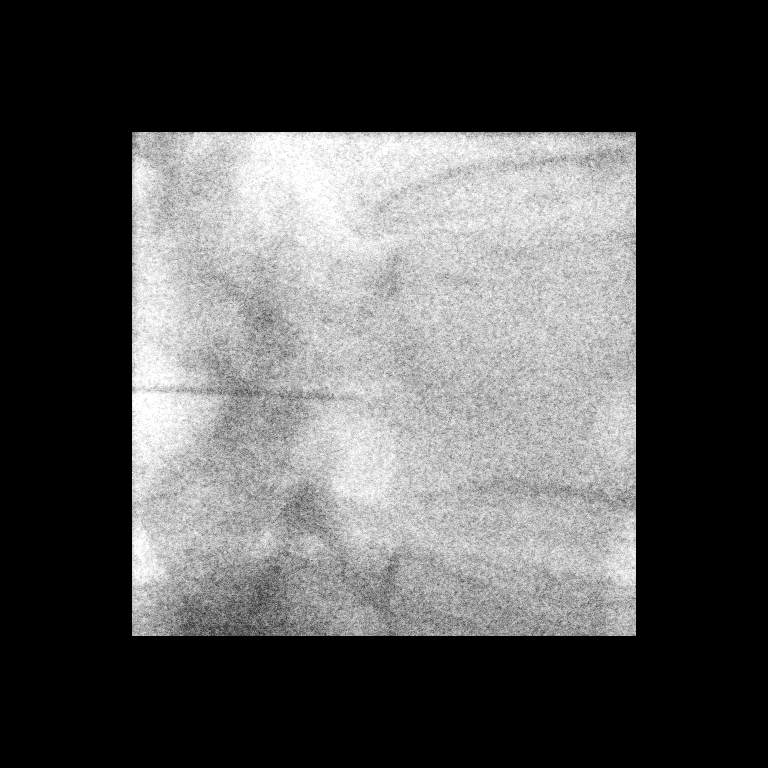

[Series 2: ortho standard · 1 of 1 slices shown (2 of 2)]
[im 1/1]
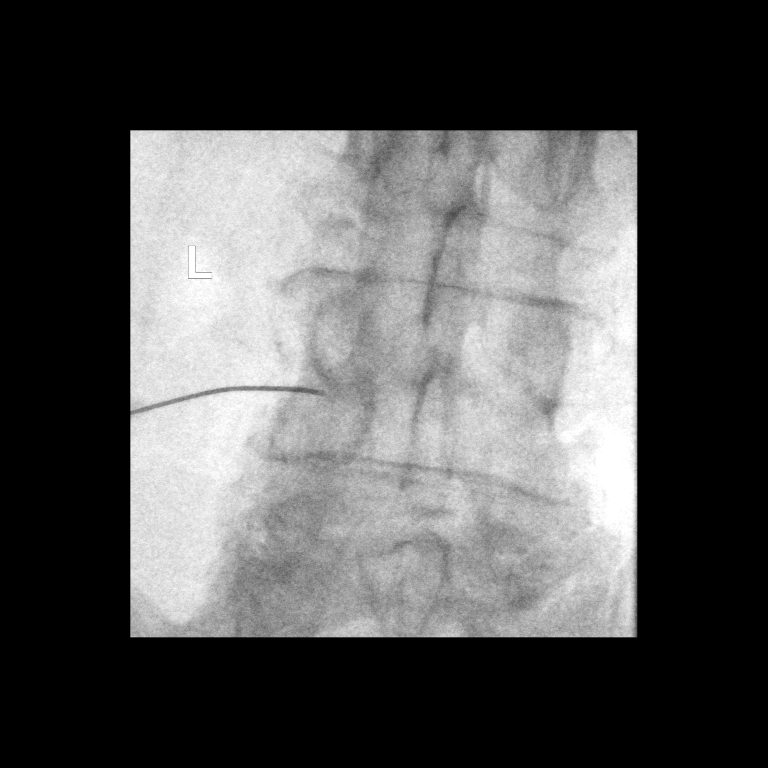

[2 of 2 positions shown; findings below may reference images not displayed]

EXAM:
DG INJECT/JAFFRE INC NEEDLE/NGOC/APARNA EPI/YALONDA/SAC W/IMG

FLUOROSCOPY TIME:  Radiation Exposure Index (as provided by the
fluoroscopic device): 7.7 mGy

Fluoroscopy Time:  19 seconds

Number of Acquired Images:  0

PROCEDURE:
The procedure, risks, benefits, and alternatives were explained to
the patient. Questions regarding the procedure were encouraged and
answered. The patient understands and consents to the procedure.

LEFT L4 NERVE ROOT BLOCK AND TRANSFORAMINAL EPIDURAL: A posterior
oblique approach was taken to the intervertebral foramen on the left
at L4-L5 using a 5 inch 22 gauge spinal needle. Injection of Isovue
M 200 outlined the left L4 nerve root and showed good epidural
spread. No vascular opacification is seen. 120 mg of Depo-Medrol
mixed with 2 mL of 1% lidocaine were instilled. The procedure was
well-tolerated, and the patient was discharged thirty minutes
following the injection in good condition.

COMPLICATIONS:
None immediate.
IMPRESSION: Technically successful injection consisting of a left L4 nerve root
block and transforaminal epidural.

## 2021-01-03 DIAGNOSIS — M7051 Other bursitis of knee, right knee: Secondary | ICD-10-CM | POA: Diagnosis not present

## 2021-01-03 DIAGNOSIS — Z6827 Body mass index (BMI) 27.0-27.9, adult: Secondary | ICD-10-CM | POA: Diagnosis not present

## 2021-01-31 DIAGNOSIS — M5459 Other low back pain: Secondary | ICD-10-CM | POA: Diagnosis not present

## 2021-02-23 DIAGNOSIS — M5459 Other low back pain: Secondary | ICD-10-CM | POA: Diagnosis not present

## 2021-02-24 DIAGNOSIS — Z471 Aftercare following joint replacement surgery: Secondary | ICD-10-CM | POA: Diagnosis not present

## 2021-02-24 DIAGNOSIS — Z96652 Presence of left artificial knee joint: Secondary | ICD-10-CM | POA: Diagnosis not present

## 2021-02-24 DIAGNOSIS — M25462 Effusion, left knee: Secondary | ICD-10-CM | POA: Diagnosis not present

## 2021-02-24 DIAGNOSIS — I7090 Unspecified atherosclerosis: Secondary | ICD-10-CM | POA: Diagnosis not present

## 2021-03-22 DIAGNOSIS — M47896 Other spondylosis, lumbar region: Secondary | ICD-10-CM | POA: Diagnosis not present

## 2021-03-22 DIAGNOSIS — M47816 Spondylosis without myelopathy or radiculopathy, lumbar region: Secondary | ICD-10-CM | POA: Diagnosis not present

## 2021-04-11 DIAGNOSIS — M7051 Other bursitis of knee, right knee: Secondary | ICD-10-CM | POA: Diagnosis not present

## 2021-04-11 DIAGNOSIS — I10 Essential (primary) hypertension: Secondary | ICD-10-CM | POA: Diagnosis not present

## 2021-04-11 DIAGNOSIS — J449 Chronic obstructive pulmonary disease, unspecified: Secondary | ICD-10-CM | POA: Diagnosis not present

## 2021-04-11 DIAGNOSIS — Z6825 Body mass index (BMI) 25.0-25.9, adult: Secondary | ICD-10-CM | POA: Diagnosis not present

## 2021-04-11 DIAGNOSIS — Z1331 Encounter for screening for depression: Secondary | ICD-10-CM | POA: Diagnosis not present

## 2021-04-19 DIAGNOSIS — M47816 Spondylosis without myelopathy or radiculopathy, lumbar region: Secondary | ICD-10-CM | POA: Diagnosis not present

## 2021-04-21 DIAGNOSIS — Z1231 Encounter for screening mammogram for malignant neoplasm of breast: Secondary | ICD-10-CM | POA: Diagnosis not present

## 2021-04-22 DIAGNOSIS — I10 Essential (primary) hypertension: Secondary | ICD-10-CM | POA: Diagnosis not present

## 2021-04-22 DIAGNOSIS — J449 Chronic obstructive pulmonary disease, unspecified: Secondary | ICD-10-CM | POA: Diagnosis not present

## 2021-05-17 DIAGNOSIS — M47816 Spondylosis without myelopathy or radiculopathy, lumbar region: Secondary | ICD-10-CM | POA: Diagnosis not present

## 2021-05-31 DIAGNOSIS — M810 Age-related osteoporosis without current pathological fracture: Secondary | ICD-10-CM | POA: Diagnosis not present

## 2021-05-31 DIAGNOSIS — Z780 Asymptomatic menopausal state: Secondary | ICD-10-CM | POA: Diagnosis not present

## 2021-06-07 DIAGNOSIS — M47816 Spondylosis without myelopathy or radiculopathy, lumbar region: Secondary | ICD-10-CM | POA: Diagnosis not present

## 2021-06-09 DIAGNOSIS — M7051 Other bursitis of knee, right knee: Secondary | ICD-10-CM | POA: Diagnosis not present

## 2021-06-09 DIAGNOSIS — Z6825 Body mass index (BMI) 25.0-25.9, adult: Secondary | ICD-10-CM | POA: Diagnosis not present

## 2021-06-09 DIAGNOSIS — Z23 Encounter for immunization: Secondary | ICD-10-CM | POA: Diagnosis not present

## 2021-06-23 DIAGNOSIS — R0781 Pleurodynia: Secondary | ICD-10-CM | POA: Diagnosis not present

## 2021-06-23 DIAGNOSIS — M792 Neuralgia and neuritis, unspecified: Secondary | ICD-10-CM | POA: Diagnosis not present

## 2021-06-23 DIAGNOSIS — M47817 Spondylosis without myelopathy or radiculopathy, lumbosacral region: Secondary | ICD-10-CM | POA: Diagnosis not present

## 2021-07-12 DIAGNOSIS — I10 Essential (primary) hypertension: Secondary | ICD-10-CM | POA: Diagnosis not present

## 2021-07-12 DIAGNOSIS — M7051 Other bursitis of knee, right knee: Secondary | ICD-10-CM | POA: Diagnosis not present

## 2021-07-12 DIAGNOSIS — F411 Generalized anxiety disorder: Secondary | ICD-10-CM | POA: Diagnosis not present

## 2021-07-12 DIAGNOSIS — Z6824 Body mass index (BMI) 24.0-24.9, adult: Secondary | ICD-10-CM | POA: Diagnosis not present

## 2021-08-10 ENCOUNTER — Other Ambulatory Visit: Payer: Self-pay

## 2021-08-10 ENCOUNTER — Ambulatory Visit: Payer: Medicare Other | Admitting: Dermatology

## 2021-08-10 ENCOUNTER — Encounter: Payer: Self-pay | Admitting: Dermatology

## 2021-08-10 DIAGNOSIS — Z85828 Personal history of other malignant neoplasm of skin: Secondary | ICD-10-CM | POA: Diagnosis not present

## 2021-08-10 DIAGNOSIS — L570 Actinic keratosis: Secondary | ICD-10-CM

## 2021-08-10 DIAGNOSIS — Z1283 Encounter for screening for malignant neoplasm of skin: Secondary | ICD-10-CM

## 2021-08-12 DIAGNOSIS — M47816 Spondylosis without myelopathy or radiculopathy, lumbar region: Secondary | ICD-10-CM | POA: Diagnosis not present

## 2021-08-12 DIAGNOSIS — M47817 Spondylosis without myelopathy or radiculopathy, lumbosacral region: Secondary | ICD-10-CM | POA: Diagnosis not present

## 2021-08-24 ENCOUNTER — Telehealth: Payer: Self-pay | Admitting: Dermatology

## 2021-08-24 NOTE — Telephone Encounter (Signed)
Patient left message on office voice mail that the places that were frozen by Lavonna Monarch, M.D. have peeled off and are "as smooth as a baby's tail".

## 2021-08-27 DIAGNOSIS — M47816 Spondylosis without myelopathy or radiculopathy, lumbar region: Secondary | ICD-10-CM | POA: Diagnosis not present

## 2021-09-02 ENCOUNTER — Encounter: Payer: Self-pay | Admitting: Dermatology

## 2021-09-02 NOTE — Progress Notes (Signed)
° °  Follow-Up Visit   Subjective  Alisha Murphy is a 87 y.o. female who presents for the following: Annual Exam (Lesion on nose x  5 months. Personal history of bcc and scc. ).  History of skin cancer arm, new crust on nose Location:  Duration:  Quality:  Associated Signs/Symptoms: Modifying Factors:  Severity:  Timing: Context:   Objective  Well appearing patient in no apparent distress; mood and affect are within normal limits. Right Nasal Sidewall x 1 right cheek x 1 (2) Gritty subtle 4 mm crust  Left Forearm - Posterior No sign recurrent carcinoma    A focused examination was performed including head, neck, arms, back.. Relevant physical exam findings are noted in the Assessment and Plan.   Assessment & Plan    Solar keratosis (2) Right Nasal Sidewall x 1 right cheek x 1  Patient will call with follow up, return if freezing fails  Destruction of lesion - Right Nasal Sidewall x 1 right cheek x 1 Complexity: simple   Destruction method: cryotherapy   Informed consent: discussed and consent obtained   Timeout:  patient name, date of birth, surgical site, and procedure verified Lesion destroyed using liquid nitrogen: Yes   Cryotherapy cycles:  5 Outcome: patient tolerated procedure well with no complications   Post-procedure details: wound care instructions given    Personal history of skin cancer Left Forearm - Posterior  Recheck.  Change      I, Lavonna Monarch, MD, have reviewed all documentation for this visit.  The documentation on 09/02/21 for the exam, diagnosis, procedures, and orders are all accurate and complete.

## 2021-09-12 DIAGNOSIS — M25511 Pain in right shoulder: Secondary | ICD-10-CM | POA: Diagnosis not present

## 2021-09-12 DIAGNOSIS — M19011 Primary osteoarthritis, right shoulder: Secondary | ICD-10-CM | POA: Diagnosis not present

## 2021-09-12 DIAGNOSIS — R600 Localized edema: Secondary | ICD-10-CM | POA: Diagnosis not present

## 2021-09-12 DIAGNOSIS — Z6824 Body mass index (BMI) 24.0-24.9, adult: Secondary | ICD-10-CM | POA: Diagnosis not present

## 2021-09-12 DIAGNOSIS — S52501A Unspecified fracture of the lower end of right radius, initial encounter for closed fracture: Secondary | ICD-10-CM | POA: Diagnosis not present

## 2021-09-12 DIAGNOSIS — M25531 Pain in right wrist: Secondary | ICD-10-CM | POA: Diagnosis not present

## 2021-09-12 DIAGNOSIS — R52 Pain, unspecified: Secondary | ICD-10-CM | POA: Diagnosis not present

## 2021-10-05 DIAGNOSIS — Z6824 Body mass index (BMI) 24.0-24.9, adult: Secondary | ICD-10-CM | POA: Diagnosis not present

## 2021-10-05 DIAGNOSIS — S61419A Laceration without foreign body of unspecified hand, initial encounter: Secondary | ICD-10-CM | POA: Diagnosis not present

## 2021-10-10 DIAGNOSIS — M79641 Pain in right hand: Secondary | ICD-10-CM | POA: Diagnosis not present

## 2021-10-21 DIAGNOSIS — S52571A Other intraarticular fracture of lower end of right radius, initial encounter for closed fracture: Secondary | ICD-10-CM | POA: Diagnosis not present

## 2021-10-21 DIAGNOSIS — W19XXXA Unspecified fall, initial encounter: Secondary | ICD-10-CM | POA: Diagnosis not present

## 2021-10-21 DIAGNOSIS — M25531 Pain in right wrist: Secondary | ICD-10-CM | POA: Diagnosis not present

## 2021-10-21 DIAGNOSIS — R296 Repeated falls: Secondary | ICD-10-CM | POA: Diagnosis not present

## 2021-10-24 DIAGNOSIS — S52571A Other intraarticular fracture of lower end of right radius, initial encounter for closed fracture: Secondary | ICD-10-CM | POA: Diagnosis not present

## 2021-10-27 DIAGNOSIS — R296 Repeated falls: Secondary | ICD-10-CM | POA: Diagnosis not present

## 2021-10-27 DIAGNOSIS — M25531 Pain in right wrist: Secondary | ICD-10-CM | POA: Diagnosis not present

## 2021-10-27 DIAGNOSIS — S52571D Other intraarticular fracture of lower end of right radius, subsequent encounter for closed fracture with routine healing: Secondary | ICD-10-CM | POA: Diagnosis not present

## 2021-11-10 DIAGNOSIS — S52571D Other intraarticular fracture of lower end of right radius, subsequent encounter for closed fracture with routine healing: Secondary | ICD-10-CM | POA: Diagnosis not present

## 2021-11-17 DIAGNOSIS — M21061 Valgus deformity, not elsewhere classified, right knee: Secondary | ICD-10-CM | POA: Diagnosis not present

## 2021-11-17 DIAGNOSIS — M1711 Unilateral primary osteoarthritis, right knee: Secondary | ICD-10-CM | POA: Diagnosis not present

## 2021-11-21 DIAGNOSIS — J1520 Pneumonia due to staphylococcus, unspecified: Secondary | ICD-10-CM | POA: Diagnosis not present

## 2021-11-21 DIAGNOSIS — Z6824 Body mass index (BMI) 24.0-24.9, adult: Secondary | ICD-10-CM | POA: Diagnosis not present

## 2021-12-15 DIAGNOSIS — M21061 Valgus deformity, not elsewhere classified, right knee: Secondary | ICD-10-CM | POA: Diagnosis not present

## 2021-12-15 DIAGNOSIS — S52571D Other intraarticular fracture of lower end of right radius, subsequent encounter for closed fracture with routine healing: Secondary | ICD-10-CM | POA: Diagnosis not present

## 2021-12-15 DIAGNOSIS — M1711 Unilateral primary osteoarthritis, right knee: Secondary | ICD-10-CM | POA: Diagnosis not present

## 2021-12-15 DIAGNOSIS — W19XXXD Unspecified fall, subsequent encounter: Secondary | ICD-10-CM | POA: Diagnosis not present

## 2022-01-05 DIAGNOSIS — J4520 Mild intermittent asthma, uncomplicated: Secondary | ICD-10-CM | POA: Diagnosis not present

## 2022-01-05 DIAGNOSIS — G629 Polyneuropathy, unspecified: Secondary | ICD-10-CM | POA: Diagnosis not present

## 2022-01-05 DIAGNOSIS — I10 Essential (primary) hypertension: Secondary | ICD-10-CM | POA: Diagnosis not present

## 2022-01-05 DIAGNOSIS — M150 Primary generalized (osteo)arthritis: Secondary | ICD-10-CM | POA: Diagnosis not present

## 2022-01-19 ENCOUNTER — Ambulatory Visit (INDEPENDENT_AMBULATORY_CARE_PROVIDER_SITE_OTHER): Payer: Medicare Other | Admitting: Orthopaedic Surgery

## 2022-01-19 ENCOUNTER — Encounter: Payer: Self-pay | Admitting: Orthopaedic Surgery

## 2022-01-19 DIAGNOSIS — M1711 Unilateral primary osteoarthritis, right knee: Secondary | ICD-10-CM | POA: Insufficient documentation

## 2022-01-19 NOTE — Progress Notes (Signed)
Office Visit Note   Patient: Alisha Murphy           Date of Birth: 05-15-1935           MRN: [id number] Visit Date: 01/19/2022              Requested by: Neale Burly, MD [place],  [place] [id number] PCP: Neale Burly, MD   Assessment & Plan: Visit Diagnoses:  1. Unilateral primary osteoarthritis, right knee     Plan: We will seek approval for Visco injections right knee.  Follow-up after approval for right knee injection.  Follow-Up Instructions: No follow-ups on file.   Orders:  No orders of the defined types were placed in this encounter.  No orders of the defined types were placed in this encounter.     Procedures: No procedures performed   Clinical Data: No additional findings.   Subjective: Chief Complaint  Patient presents with   Right Knee - Pain    Wants to discuss options    HPI 87 year old female here with progressive right knee pain and valgus.  Previous left total knee arthroplasty 2021 done at [facility] near [place] with left total knee arthroplasty doing well.  She some memory problems and son states she had good result but it took extensive time for mobility and strengthening.  Right knee has not responded to cortisone injection she is asking and requesting to try a Visco injections  Additional problems with colitis lumbar radiculopathy history of renal failure previous CVA.  She is on calcium and vitamin D.  Review of Systems all the systems noncontributory to HPI.   Objective: Vital Signs: Ht '5\' 2"'$  (1.575 m)   Wt 134 lb (60.8 kg)   BMI 24.51 kg/m   Physical Exam Constitutional:      Appearance: She is well-developed.  HENT:     Head: Normocephalic.     Right Ear: External ear normal.     Left Ear: External ear normal. There is no impacted cerumen.  Eyes:     Pupils: Pupils are equal, round, and reactive to light.  Neck:     Thyroid: No thyromegaly.     Trachea: No tracheal deviation.   Cardiovascular:     Rate and Rhythm: Normal rate.  Pulmonary:     Effort: Pulmonary effort is normal.  Abdominal:     Palpations: Abdomen is soft.  Musculoskeletal:     Cervical back: No rigidity.  Skin:    General: Skin is warm and dry.  Neurological:     Mental Status: She is alert and oriented to person, place, and time.  Psychiatric:        Behavior: Behavior normal.     Ortho Exam patient has palpable pedal pulses negative logroll the hips.  Well-healed midline left total knee arthroplasty incision with good collateral balance full extension flexion past 90.  Right knee shows 1015 degrees valgus crepitus lateral laxity patellofemoral crepitus.  Negative logroll right hip.  Specialty Comments:  No specialty comments available.  Imaging: Outside x-rays brought with patient dated 11/17/2021 shows bone-on-bone changes right knee with lateral compartment bone-on-bone marginal osteophytes some medial subluxation 1 cm femur on tibia and 10 to 15 degrees of valgus.  Well-positioned total knee cemented on the left in good position and alignment.   PMFS History: Patient Active Problem List   Diagnosis Date Noted   Unilateral primary osteoarthritis, right knee 01/19/2022   Lumbar compression fracture ([place]) 07/22/2020  Lumbar radiculopathy 06/16/2019   History of acute renal failure 06/16/2019   Left leg pain 06/16/2019   History of ischemic stroke 06/16/2019   GERD 04/26/2010   COLITIS 04/26/2010   ABDOMINAL PAIN, UNSPECIFIED SITE 04/26/2010   PUD, HX OF 04/26/2010   Past Medical History:  Diagnosis Date   Atrial fibrillation ([place])    Basal cell carcinoma 08/03/2015   Right neck-(CX35FU&exc)   Basal cell carcinoma 03/13/2016   nod-Right neck-txpbx   Cataract    GERD (gastroesophageal reflux disease)    Joint pain    SCCA (squamous cell carcinoma) of skin 11/05/2019   Left Forearem Posterior(in situ)   Squamous cell carcinoma in situ (SCCIS) 11/05/2019   Left Forearm  Posterior   Stroke ([place])     Family History  Problem Relation Age of Onset   Cancer Mother    Heart Problems Father     Past Surgical History:  Procedure Laterality Date   CATARACT EXTRACTION     HIP SURGERY     HYSTERECTOMY ABDOMINAL WITH SALPINGECTOMY     Social History   Occupational History   Not on file  Tobacco Use   Smoking status: Never   Smokeless tobacco: Never  Substance and Sexual Activity   Alcohol use: Not Currently   Drug use: Never   Sexual activity: Not on file

## 2022-01-30 ENCOUNTER — Other Ambulatory Visit: Payer: Self-pay

## 2022-01-30 DIAGNOSIS — M1711 Unilateral primary osteoarthritis, right knee: Secondary | ICD-10-CM

## 2022-02-02 ENCOUNTER — Ambulatory Visit (INDEPENDENT_AMBULATORY_CARE_PROVIDER_SITE_OTHER): Payer: Medicare Other | Admitting: Orthopaedic Surgery

## 2022-02-02 DIAGNOSIS — M1711 Unilateral primary osteoarthritis, right knee: Secondary | ICD-10-CM | POA: Diagnosis not present

## 2022-02-02 MED ORDER — LIDOCAINE HCL 1 % IJ SOLN
0.5000 mL | INTRAMUSCULAR | Status: AC | PRN
  Administered 2022-02-02: .5 mL

## 2022-02-02 MED ORDER — HYLAN G-F 20 16 MG/2ML IX SOSY
16.0000 mg | PREFILLED_SYRINGE | INTRA_ARTICULAR | Status: AC | PRN
  Administered 2022-02-02: 16 mg via INTRA_ARTICULAR

## 2022-02-02 NOTE — Progress Notes (Signed)
   Office Visit Note   Patient: Alisha Murphy           Date of Birth: 1934/11/20           MRN: [id number] Visit Date: 02/02/2022              Requested by: Neale Burly, MD [place],  [place] [id number] PCP: Neale Burly, MD   Assessment & Plan: Visit Diagnoses:  1. Unilateral primary osteoarthritis, right knee     Plan: Patient will get injection next Wednesday with Dr.Cairns and then can see me the following Thursday for third and final Synvisc injection.  Follow-Up Instructions: No follow-ups on file.   Orders:  No orders of the defined types were placed in this encounter.  No orders of the defined types were placed in this encounter.     Procedures: Large Joint Inj: R knee on 02/02/2022 3:22 PM Indications: pain and joint swelling Details: 22 G 1.5 in needle, anterolateral approach  Arthrogram: No  Medications: 0.5 mL lidocaine 1 %; 16 mg hylan 16 MG/2ML Outcome: tolerated well, no immediate complications Procedure, treatment alternatives, risks and benefits explained, specific risks discussed. Consent was given by the patient. Immediately prior to procedure a time out was called to verify the correct patient, procedure, equipment, support staff and site/side marked as required. Patient was prepped and draped in the usual sterile fashion.       Clinical Data: No additional findings.   Subjective: No chief complaint on file.   HPI patient here for right knee Synvisc injection #1.  Review of Systems unchanged   Objective: Vital Signs: There were no vitals taken for this visit.  Physical Exam no change  Ortho Exam crepitus knee range of motion no cellulitis.  Specialty Comments:  No specialty comments available.  Imaging: No results found.   PMFS History: Patient Active Problem List   Diagnosis Date Noted   Unilateral primary osteoarthritis, right knee 01/19/2022   Lumbar compression fracture ([facility]) 07/22/2020   Lumbar  radiculopathy 06/16/2019   History of acute renal failure 06/16/2019   Left leg pain 06/16/2019   History of ischemic stroke 06/16/2019   GERD 04/26/2010   COLITIS 04/26/2010   ABDOMINAL PAIN, UNSPECIFIED SITE 04/26/2010   PUD, HX OF 04/26/2010   Past Medical History:  Diagnosis Date   Atrial fibrillation ([place])    Basal cell carcinoma 08/03/2015   Right neck-(CX35FU&exc)   Basal cell carcinoma 03/13/2016   nod-Right neck-txpbx   Cataract    GERD (gastroesophageal reflux disease)    Joint pain    SCCA (squamous cell carcinoma) of skin 11/05/2019   Left Forearem Posterior(in situ)   Squamous cell carcinoma in situ (SCCIS) 11/05/2019   Left Forearm Posterior   Stroke ([place])     Family History  Problem Relation Age of Onset   Cancer Mother    Heart Problems Father     Past Surgical History:  Procedure Laterality Date   CATARACT EXTRACTION     HIP SURGERY     HYSTERECTOMY ABDOMINAL WITH SALPINGECTOMY     Social History   Occupational History   Not on file  Tobacco Use   Smoking status: Never   Smokeless tobacco: Never  Substance and Sexual Activity   Alcohol use: Not Currently   Drug use: Never   Sexual activity: Not on file

## 2022-02-08 ENCOUNTER — Ambulatory Visit (INDEPENDENT_AMBULATORY_CARE_PROVIDER_SITE_OTHER): Payer: Medicare Other | Admitting: Orthopedic Surgery

## 2022-02-08 DIAGNOSIS — M1711 Unilateral primary osteoarthritis, right knee: Secondary | ICD-10-CM | POA: Diagnosis not present

## 2022-02-08 MED ORDER — HYLAN G-F 20 16 MG/2ML IX SOSY: 20.0000 mg | PREFILLED_SYRINGE | Freq: Once | INTRA_ARTICULAR | Status: AC

## 2022-02-08 MED ORDER — HYLAN G-F 20 16 MG/2ML IX SOSY
16.0000 mg | PREFILLED_SYRINGE | Freq: Once | INTRA_ARTICULAR | Status: AC
  Administered 2022-02-08: 16 mg via INTRA_ARTICULAR

## 2022-02-09 ENCOUNTER — Encounter: Payer: Self-pay | Admitting: Orthopedic Surgery

## 2022-02-09 NOTE — Progress Notes (Signed)
Orthopaedic Clinic Return  Assessment: RALONDA TARTT is a 87 y.o. female with the following: Right knee arthritis   Plan: Mrs. Woodin return to clinic today for her second Synvisc injection.  Dr. Lorin Mercy provided her with the first injection.  This was completed in clinic today without issues.  She will follow-up with Dr. Lorin Mercy next week to complete her series.   Procedure note injection Right knee joint   Verbal consent was obtained to inject the right knee joint  Timeout was completed to confirm the site of injection.  The skin was prepped with alcohol and ethyl chloride was sprayed at the injection site.  An 18 gauge needle was used to inject 5 cc of the Synvisc (Hyaluronic acid) into the right knee using an anterolateral approach.  There were no complications. A sterile bandage was applied.   Meds ordered this encounter  Medications   hylan (SYNVISC) intra-articular injection 20 mg   hylan (SYNVISC) intra-articular injection 16 mg    Follow-up: Return in about 1 week (around 02/15/2022).   Subjective:  Chief Complaint  Patient presents with   Injections    Synvisc Right knee #2    History of Present Illness: RAIDYN BREINER is a 87 y.o. female who returns to clinic for repeat evaluation of her right knee.  She has chronic right knee pain.  She has evidence of arthritis.  She had her first Synvisc injection 1 week ago.  No issues following this injection.  She did have some bruising and localized tenderness at the injection site.  Review of Systems: No fevers or chills No numbness or tingling No chest pain No shortness of breath No bowel or bladder dysfunction No GI distress No headaches   Objective: There were no vitals taken for this visit.    Physical Exam:  Right knee with mild valgus alignment overall.  10 degree flexion contracture, tolerating flexion beyond 100 degrees.  Tenderness to palpation of both the medial lateral joint lines.  No crepitus is  appreciated.  No increased laxity varus or valgus stress.  Negative Lachman.  IMAGING: I personally ordered and reviewed the following images:  No new imaging obtained today  Mordecai Rasmussen, MD 02/09/2022 7:22 AM

## 2022-02-09 NOTE — Patient Instructions (Signed)

## 2022-02-16 ENCOUNTER — Ambulatory Visit (INDEPENDENT_AMBULATORY_CARE_PROVIDER_SITE_OTHER): Payer: Medicare Other | Admitting: Orthopaedic Surgery

## 2022-02-16 ENCOUNTER — Encounter: Payer: Self-pay | Admitting: Orthopaedic Surgery

## 2022-02-16 DIAGNOSIS — M1711 Unilateral primary osteoarthritis, right knee: Secondary | ICD-10-CM

## 2022-02-16 MED ORDER — LIDOCAINE HCL 1 % IJ SOLN
0.5000 mL | INTRAMUSCULAR | Status: AC | PRN
  Administered 2022-02-16: .5 mL

## 2022-02-16 MED ORDER — HYLAN G-F 20 16 MG/2ML IX SOSY
16.0000 mg | PREFILLED_SYRINGE | INTRA_ARTICULAR | Status: AC | PRN
  Administered 2022-02-16: 16 mg via INTRA_ARTICULAR

## 2022-02-16 NOTE — Addendum Note (Signed)
Addended by: Marybelle Killings on: 02/16/2022 03:46 PM   Modules accepted: Level of Service

## 2022-02-16 NOTE — Progress Notes (Signed)
Office Visit Note   Patient: Alisha Murphy           Date of Birth: [birth year]           MRN: [id number] Visit Date: 02/16/2022              Requested by: Neale Burly, MD [place],  [place] [address] PCP: Neale Burly, MD   Assessment & Plan: Visit Diagnoses: Right knee primary osteoarthritis  Plan: Third injection done right knee Synvisc.  She can return in 4 weeks to discuss possible total knee arthroplasty if she decides she wants to proceed.  Follow-Up Instructions: No follow-ups on file.   Orders:  No orders of the defined types were placed in this encounter.  No orders of the defined types were placed in this encounter.     Procedures: Large Joint Inj: R knee on 02/16/2022 3:43 PM Indications: pain and joint swelling Details: 22 G 1.5 in needle, anterolateral approach  Arthrogram: No  Medications: 0.5 mL lidocaine 1 %; 16 mg hylan 16 MG/2ML Outcome: tolerated well, no immediate complications Procedure, treatment alternatives, risks and benefits explained, specific risks discussed. Consent was given by the patient. Immediately prior to procedure a time out was called to verify the correct patient, procedure, equipment, support staff and site/side marked as required. Patient was prepped and draped in the usual sterile fashion.       Clinical Data: No additional findings.   Subjective: Chief Complaint  Patient presents with   Right Knee - Pain, Follow-up    Supposed to get #3 Synvisc injections, but c/o pain worse than when she started, unable to bear much weight on leg, and severe pain in lower leg.     HPI patient is here for third and final right knee Synvisc injection.  She states she really has not noticed much improvement.  She had bone-on-bone changes lateral compartment and has had previous left total knee arthroplasty.  Of note is history of previous hip surgeries and admission for hip infection with abscess with history of  multiple cortisone injections and treatment with IV antibiotics without prosthetic removal and these records are in another hospital system and I am unsure if infection was deep or this was just a subcutaneous abscess.  In any event she has had positive cultures for MRSA.  Review of Systems noncontributory   Objective: Vital Signs: There were no vitals taken for this visit.  Physical Exam unchanged  Ortho Exam no reaction from previous right knee Synvisc injections first and second.  Specialty Comments:  No specialty comments available.  Imaging: No results found.   PMFS History: Patient Active Problem List   Diagnosis Date Noted   Unilateral primary osteoarthritis, right knee 01/19/2022   Lumbar compression fracture (HCC) 07/22/2020   Lumbar radiculopathy 06/16/2019   History of acute renal failure 06/16/2019   Left leg pain 06/16/2019   History of ischemic stroke 06/16/2019   GERD 04/26/2010   COLITIS 04/26/2010   ABDOMINAL PAIN, UNSPECIFIED SITE 04/26/2010   PUD, HX OF 04/26/2010   Past Medical History:  Diagnosis Date   Atrial fibrillation ([place])    Basal cell carcinoma 08/03/2015   Right neck-(CX35FU&exc)   Basal cell carcinoma 03/13/2016   nod-Right neck-txpbx   Cataract    GERD (gastroesophageal reflux disease)    Joint pain    SCCA (squamous cell carcinoma) of skin 11/05/2019   Left Forearem Posterior(in situ)   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

## 2022-03-03 DIAGNOSIS — M47816 Spondylosis without myelopathy or radiculopathy, lumbar region: Secondary | ICD-10-CM | POA: Diagnosis not present

## 2022-03-16 DIAGNOSIS — M25561 Pain in right knee: Secondary | ICD-10-CM | POA: Diagnosis not present

## 2022-03-16 DIAGNOSIS — M47896 Other spondylosis, lumbar region: Secondary | ICD-10-CM | POA: Diagnosis not present

## 2022-03-16 DIAGNOSIS — M545 Low back pain, unspecified: Secondary | ICD-10-CM | POA: Diagnosis not present

## 2022-03-23 ENCOUNTER — Ambulatory Visit: Payer: Medicare Other | Admitting: Orthopaedic Surgery

## 2022-03-24 DIAGNOSIS — M47816 Spondylosis without myelopathy or radiculopathy, lumbar region: Secondary | ICD-10-CM | POA: Diagnosis not present

## 2022-03-24 DIAGNOSIS — M25561 Pain in right knee: Secondary | ICD-10-CM | POA: Diagnosis not present

## 2022-04-04 DIAGNOSIS — M1711 Unilateral primary osteoarthritis, right knee: Secondary | ICD-10-CM | POA: Diagnosis not present

## 2022-04-04 DIAGNOSIS — M25561 Pain in right knee: Secondary | ICD-10-CM | POA: Diagnosis not present

## 2022-04-05 DIAGNOSIS — J40 Bronchitis, not specified as acute or chronic: Secondary | ICD-10-CM | POA: Diagnosis not present

## 2022-04-05 DIAGNOSIS — Z6824 Body mass index (BMI) 24.0-24.9, adult: Secondary | ICD-10-CM | POA: Diagnosis not present

## 2022-04-21 DIAGNOSIS — M25561 Pain in right knee: Secondary | ICD-10-CM | POA: Diagnosis not present

## 2022-05-02 DIAGNOSIS — Z0000 Encounter for general adult medical examination without abnormal findings: Secondary | ICD-10-CM | POA: Diagnosis not present

## 2022-05-02 DIAGNOSIS — Z6824 Body mass index (BMI) 24.0-24.9, adult: Secondary | ICD-10-CM | POA: Diagnosis not present

## 2022-05-02 DIAGNOSIS — C4491 Basal cell carcinoma of skin, unspecified: Secondary | ICD-10-CM | POA: Diagnosis not present

## 2022-05-26 NOTE — Progress Notes (Signed)
Sent message, via epic in basket, requesting orders in epic from surgeon.  

## 2022-05-30 NOTE — Progress Notes (Addendum)
Anesthesia Review:  PCP: DR hasanaj- clearance on chart dated  have requested LOV note .  They are to fax.   LOV 05/02/22 on chart  Cardiologist : none  Chest x-ray : EKG : 06/02/22  Echo : Stress test: Cardiac Cath :  Activity level: can do a flight of stairs without difficutly  Sleep Study/ CPAP  none  Fasting Blood Sugar :      / Checks Blood Sugar -- times a day:   Blood Thinner/ Instructions /Last Dose: ASA / Instructions/ Last Dose :  81 mg aspirin   Pulse at preop was 54.  On EKG was 48.  Pt voices non complaints.  Asymptomatic.  Alisha Murphy, [facility] made aware.  PT states her MD has stated her pulse is slow at appts.

## 2022-05-31 NOTE — H&P (Signed)
TOTAL KNEE ADMISSION H&P  Patient is being admitted for right total knee arthroplasty.  Subjective:  Chief Complaint:right knee pain.  HPI: Alisha Murphy, 87 y.o. female, has a history of pain and functional disability in the right knee due to arthritis and has failed non-surgical conservative treatments for greater than 12 weeks to includeNSAID's and/or analgesics, corticosteriod injections, viscosupplementation injections, use of assistive devices, and activity modification.  Onset of symptoms was gradual, starting 3 years ago with gradually worsening course since that time. The patient noted no past surgery on the right knee(s).  Patient currently rates pain in the right knee(s) at 9 out of 10 with activity. Patient has night pain, worsening of pain with activity and weight bearing, pain that interferes with activities of daily living, crepitus, and joint swelling.  Patient has evidence of periarticular osteophytes and joint space narrowing by imaging studies. This patient has had  no previous injury . There is no active infection.  Patient Active Problem List   Diagnosis Date Noted   Unilateral primary osteoarthritis, right knee 01/19/2022   Lumbar compression fracture (HCC) 07/22/2020   Lumbar radiculopathy 06/16/2019   History of acute renal failure 06/16/2019   Left leg pain 06/16/2019   History of ischemic stroke 06/16/2019   GERD 04/26/2010   COLITIS 04/26/2010   ABDOMINAL PAIN, UNSPECIFIED SITE 04/26/2010   PUD, HX OF 04/26/2010   Past Medical History:  Diagnosis Date   Atrial fibrillation ([place])    Basal cell carcinoma 08/03/2015   Right neck-(CX35FU&exc)   Basal cell carcinoma 03/13/2016   nod-Right neck-txpbx   Cataract    GERD (gastroesophageal reflux disease)    Joint pain    SCCA (squamous cell carcinoma) of skin 11/05/2019   Left Forearem Posterior(in situ)   Squamous cell carcinoma in situ (SCCIS) 11/05/2019   Left Forearm Posterior   Stroke ([place])     Past  Surgical History:  Procedure Laterality Date   CATARACT EXTRACTION     HIP SURGERY     HYSTERECTOMY ABDOMINAL WITH SALPINGECTOMY      No current facility-administered medications for this encounter.   Current Outpatient Medications  Medication Sig Dispense Refill Last Dose   albuterol (VENTOLIN HFA) 108 (90 Base) MCG/ACT inhaler       amLODipine (NORVASC) 5 MG tablet Take 5 mg by mouth daily.      aspirin 81 MG chewable tablet Chew by mouth.      budesonide-formoterol (SYMBICORT) 80-4.5 MCG/ACT inhaler       Calcium Carbonate-Vitamin D (OYSTER SHELL CALCIUM 500 + D) 500-125 MG-UNIT TABS Take by mouth.      cephALEXin (KEFLEX) 500 MG capsule Take 500 mg by mouth 2 (two) times daily. (Patient not taking: Reported on 08/10/2021)      chlorthalidone (HYGROTON) 25 MG tablet Take 25 mg by mouth daily.      gabapentin (NEURONTIN) 100 MG capsule Take by mouth.      lidocaine (LIDODERM) 5 % 1 patch daily.      methylPREDNISolone (MEDROL DOSEPAK) 4 MG TBPK tablet  (Patient not taking: Reported on 08/19/2020)      metoprolol succinate (TOPROL-XL) 50 MG 24 hr tablet       Multiple Vitamins-Minerals (HAIR SKIN NAILS PO) Take by mouth. (Patient not taking: Reported on 08/10/2021)      Omega-3 1000 MG CAPS Take by mouth. (Patient not taking: Reported on 08/10/2021)      traMADol (ULTRAM) 50 MG tablet Take 1 tablet (50 mg total) by mouth  every 12 (twelve) hours as needed. (Patient not taking: Reported on 08/10/2021) 40 tablet 0    vitamin B-12 (CYANOCOBALAMIN) 1000 MCG tablet Take by mouth. (Patient not taking: Reported on 08/10/2021)      Allergies  Allergen Reactions   Doxycycline Rash    Social History   Tobacco Use   Smoking status: Never   Smokeless tobacco: Never  Substance Use Topics   Alcohol use: Not Currently    Family History  Problem Relation Age of Onset   Cancer Mother    Heart Problems Father      Review of Systems  All other systems reviewed and are  negative.   Objective:  Physical Exam Constitutional:      Appearance: Normal appearance.  HENT:     Head: Normocephalic and atraumatic.  Cardiovascular:     Rate and Rhythm: Normal rate and regular rhythm.     Pulses: Normal pulses.     Heart sounds: Normal heart sounds.  Pulmonary:     Effort: Pulmonary effort is normal. No respiratory distress.     Breath sounds: Normal breath sounds. No stridor. No wheezing, rhonchi or rales.  Chest:     Chest wall: No tenderness.  Musculoskeletal:        General: Swelling, tenderness and deformity present.  Skin:    General: Skin is warm and dry.  Neurological:     General: No focal deficit present.     Mental Status: She is alert and oriented to person, place, and time.  Psychiatric:        Mood and Affect: Mood normal.        Behavior: Behavior normal.        Thought Content: Thought content normal.        Judgment: Judgment normal.     Vital signs in last 24 hours:    Labs:   Estimated body mass index is 24.51 kg/m as calculated from the following:   Height as of 01/19/22: '5\' 2"'$  (1.575 m).   Weight as of 01/19/22: 60.8 kg.   Imaging Review Plain radiographs demonstrate moderate degenerative joint disease of the right knee(s). The overall alignment ismild varus. The bone quality appears to be good for age and reported activity level.      Assessment/Plan:  End stage arthritis, right knee   The patient history, physical examination, clinical judgment of the provider and imaging studies are consistent with end stage degenerative joint disease of the right knee(s) and total knee arthroplasty is deemed medically necessary. The treatment options including medical management, injection therapy arthroscopy and arthroplasty were discussed at length. The risks and benefits of total knee arthroplasty were presented and reviewed. The risks due to aseptic loosening, infection, stiffness, patella tracking problems, thromboembolic  complications and other imponderables were discussed. The patient acknowledged the explanation, agreed to proceed with the plan and consent was signed. Patient is being admitted for inpatient treatment for surgery, pain control, PT, OT, prophylactic antibiotics, VTE prophylaxis, progressive ambulation and ADL's and discharge planning. The patient is planning to be discharged to skilled nursing facility [facility]    Anticipated LOS equal to or greater than 2 midnights due to - Age 24 and older with one or more of the following:  - Obesity  - Expected need for hospital services (PT, OT, Nursing) required for safe  discharge  - Anticipated need for postoperative skilled nursing care or inpatient rehab  - Active co-morbidities: None OR   - Unanticipated findings during/Post  Surgery: None  - Patient is a high risk of re-admission due to: Barriers to post-acute care (logistical, no family support in home)

## 2022-05-31 NOTE — Patient Instructions (Addendum)
SURGICAL WAITING ROOM VISITATION Patients having surgery or a procedure may have no more than 2 support people in the waiting area - these visitors may rotate.   Children under the age of 17 must have an adult with them who is not the patient. If the patient needs to stay at the hospital during part of their recovery, the visitor guidelines for inpatient rooms apply. Pre-op nurse will coordinate an appropriate time for 1 support person to accompany patient in pre-op.  This support person may not rotate.    Please refer to the [facility] website for the visitor guidelines for Inpatients (after your surgery is over and you are in a regular room).       Your procedure is scheduled on:  06/14/22    Report to [facility] Main Entrance    Report to admitting at   [phone]   Call this number if you have problems the morning of surgery [phone]   Do not eat food :After Midnight.   After Midnight you may have the following liquids until _ 0430_____ AM DAY OF SURGERY  Water Non-Citrus Juices (without pulp, NO RED) Carbonated Beverages Black Coffee (NO MILK/CREAM OR CREAMERS, sugar ok)  Clear Tea (NO MILK/CREAM OR CREAMERS, sugar ok) regular and decaf                             Plain Jell-O (NO RED)                                           Fruit ices (not with fruit pulp, NO RED)                                     Popsicles (NO RED)                                                               Sports drinks like Gatorade (NO RED)                    The day of surgery:  Drink ONE (1) Pre-Surgery Clear Ensure or G2 at  0430  AM  ( have completed by ) the morning of surgery. Drink in one sitting. Do not sip.  This drink was given to you during your hospital  pre-op appointment visit. Nothing else to drink after completing the  Pre-Surgery Clear Ensure or G2.          If you have questions, please contact your surgeon's office.      Oral Hygiene is also important to  reduce your risk of infection.                                    Remember - BRUSH YOUR TEETH THE MORNING OF SURGERY WITH YOUR REGULAR TOOTHPASTE   Do NOT smoke after Midnight   Take these medicines the morning of surgery with A SIP OF WATER:  inhalers as usual and bring, amlodipine,  gabapentin, toprol   DO NOT TAKE ANY ORAL DIABETIC MEDICATIONS DAY OF YOUR SURGERY  Bring CPAP mask and tubing day of surgery.                              You may not have any metal on your body including hair pins, jewelry, and body piercing             Do not wear make-up, lotions, powders, perfumes/cologne, or deodorant  Do not wear nail polish including gel and S&S, artificial/acrylic nails, or any other type of covering on natural nails including finger and toenails. If you have artificial nails, gel coating, etc. that needs to be removed by a nail salon please have this removed prior to surgery or surgery may need to be canceled/ delayed if the surgeon/ anesthesia feels like they are unable to be safely monitored.   Do not shave  48 hours prior to surgery.               Men may shave face and neck.   Do not bring valuables to the hospital. [place].   Contacts, dentures or bridgework may not be worn into surgery.   Bring small overnight bag day of surgery.   DO NOT [place]. PHARMACY WILL DISPENSE MEDICATIONS LISTED ON YOUR MEDICATION LIST TO YOU DURING YOUR ADMISSION [place]!    Patients discharged on the day of surgery will not be allowed to drive home.  Someone NEEDS to stay with you for the first 24 hours after anesthesia.   Special Instructions: Bring a copy of your healthcare power of attorney and living will documents the day of surgery if you haven't scanned them before.              Please read over the following fact sheets you were given: IF [place] [phone]   If you received a COVID test during your pre-op visit  it is requested that you wear a mask when out in public, stay away from anyone that may not be feeling well and notify your surgeon if you develop symptoms. If you test positive for Covid or have been in contact with anyone that has tested positive in the last 10 days please notify you surgeon.    [facility] - Preparing for Surgery Before surgery, you can play an important role.  Because skin is not sterile, your skin needs to be as free of germs as possible.  You can reduce the number of germs on your skin by washing with CHG (chlorahexidine gluconate) soap before surgery.  CHG is an antiseptic cleaner which kills germs and bonds with the skin to continue killing germs even after washing. Please DO NOT use if you have an allergy to CHG or antibacterial soaps.  If your skin becomes reddened/irritated stop using the CHG and inform your nurse when you arrive at Short Stay. Do not shave (including legs and underarms) for at least 48 hours prior to the first CHG shower.  You may shave your face/neck. Please follow these instructions carefully:  1.  Shower with CHG Soap the night before surgery and the  morning of Surgery.  2.  If you choose to wash your hair, wash your hair  first as usual with your  normal  shampoo.  3.  After you shampoo, rinse your hair and body thoroughly to remove the  shampoo.                           4.  Use CHG as you would any other liquid soap.  You can apply chg directly  to the skin and wash                       Gently with a scrungie or clean washcloth.  5.  Apply the CHG Soap to your body ONLY FROM THE NECK DOWN.   Do not use on face/ open                           Wound or open sores. Avoid contact with eyes, ears mouth and genitals (private parts).                       Wash face,  Genitals (private parts) with your normal soap.             6.  Wash thoroughly, paying special attention to  the area where your surgery  will be performed.  7.  Thoroughly rinse your body with warm water from the neck down.  8.  DO NOT shower/wash with your normal soap after using and rinsing off  the CHG Soap.                9.  Pat yourself dry with a clean towel.            10.  Wear clean pajamas.            11.  Place clean sheets on your bed the night of your first shower and do not  sleep with pets. Day of Surgery : Do not apply any lotions/deodorants the morning of surgery.  Please wear clean clothes to the hospital/surgery center.  FAILURE TO FOLLOW THESE INSTRUCTIONS MAY RESULT IN THE CANCELLATION OF YOUR SURGERY PATIENT SIGNATURE_________________________________  NURSE SIGNATURE__________________________________  ________________________________________________________________________

## 2022-05-31 NOTE — Progress Notes (Signed)
Second request for pre op orders: Left a voicemail for [employer].

## 2022-06-02 ENCOUNTER — Other Ambulatory Visit: Payer: Self-pay

## 2022-06-02 ENCOUNTER — Encounter (HOSPITAL_COMMUNITY): Payer: Self-pay

## 2022-06-02 ENCOUNTER — Encounter (HOSPITAL_COMMUNITY)
Admission: RE | Admit: 2022-06-02 | Discharge: 2022-06-02 | Disposition: A | Discharge status: Home / Self Care | Payer: Medicare Other | Source: Ambulatory Visit | Attending: Specialist | Admitting: Specialist

## 2022-06-02 VITALS — BP 173/58 | HR 54 | Temp 97.7°F | Resp 16 | Ht 62.0 in

## 2022-06-02 DIAGNOSIS — Z01818 Encounter for other preprocedural examination: Secondary | ICD-10-CM | POA: Insufficient documentation

## 2022-06-02 HISTORY — DX: Pneumonia, unspecified organism: J18.9

## 2022-06-02 HISTORY — DX: Unspecified osteoarthritis, unspecified site: M19.90

## 2022-06-02 HISTORY — DX: Chronic obstructive pulmonary disease, unspecified: J44.9

## 2022-06-02 HISTORY — DX: Dyspnea, unspecified: R06.00

## 2022-06-02 LAB — TYPE AND SCREEN
ABO/RH(D): O POS
Antibody Screen: NEGATIVE

## 2022-06-02 LAB — COMPREHENSIVE METABOLIC PANEL
ALT: 16 U/L (ref 0–44)
AST: 21 U/L (ref 15–41)
Albumin: 3.8 g/dL (ref 3.5–5.0)
Alkaline Phosphatase: 120 U/L (ref 38–126)
Anion gap: 4 — ABNORMAL LOW (ref 5–15)
BUN: 17 mg/dL (ref 8–23)
CO2: 28 mmol/L (ref 22–32)
Calcium: 9.1 mg/dL (ref 8.9–10.3)
Chloride: 107 mmol/L (ref 98–111)
Creatinine, Ser: 0.65 mg/dL (ref 0.44–1.00)
GFR, Estimated: >60 mL/min (ref >60)
Glucose, Bld: 100 mg/dL — ABNORMAL HIGH (ref 70–99)
Potassium: 4.3 mmol/L (ref 3.5–5.1)
Sodium: 139 mmol/L (ref 135–145)
Total Bilirubin: 0.9 mg/dL (ref 0.3–1.2)
Total Protein: 6.9 g/dL (ref 6.5–8.1)

## 2022-06-02 LAB — CBC
HCT: 41.7 % (ref 36.0–46.0)
Hemoglobin: 13.4 g/dL (ref 12.0–15.0)
MCH: 32.7 pg (ref 26.0–34.0)
MCHC: 32.1 g/dL (ref 30.0–36.0)
MCV: 101.7 fL — ABNORMAL HIGH (ref 80.0–100.0)
Platelets: 190 10*3/uL (ref 150–400)
RBC: 4.1 MIL/uL (ref 3.87–5.11)
RDW: 14.2 % (ref 11.5–15.5)
WBC: 6 10*3/uL (ref 4.0–10.5)
nRBC: 0 % (ref 0.0–0.2)

## 2022-06-02 LAB — SURGICAL PCR SCREEN
MRSA, PCR: NEGATIVE
Staphylococcus aureus: NEGATIVE

## 2022-06-14 ENCOUNTER — Encounter (HOSPITAL_COMMUNITY): Payer: Self-pay | Admitting: Specialist

## 2022-06-14 ENCOUNTER — Other Ambulatory Visit: Payer: Self-pay

## 2022-06-14 ENCOUNTER — Inpatient Hospital Stay (HOSPITAL_BASED_OUTPATIENT_CLINIC_OR_DEPARTMENT_OTHER): Payer: Medicare Other | Admitting: Certified Registered"

## 2022-06-14 ENCOUNTER — Inpatient Hospital Stay (HOSPITAL_COMMUNITY): Payer: Medicare Other | Admitting: Physician Assistant

## 2022-06-14 ENCOUNTER — Encounter (HOSPITAL_COMMUNITY)
Admission: RE | Disposition: A | Discharge status: Skilled Nursing Facility | Payer: Self-pay | Source: Home / Self Care | Attending: Specialist

## 2022-06-14 ENCOUNTER — Inpatient Hospital Stay (HOSPITAL_COMMUNITY)
Admission: RE | Admit: 2022-06-14 | Discharge: 2022-06-19 | DRG: 470 | Disposition: A | Discharge status: Skilled Nursing Facility | Payer: Medicare Other | Attending: Specialist | Admitting: Specialist

## 2022-06-14 DIAGNOSIS — Z881 Allergy status to other antibiotic agents status: Secondary | ICD-10-CM

## 2022-06-14 DIAGNOSIS — Z471 Aftercare following joint replacement surgery: Secondary | ICD-10-CM | POA: Diagnosis not present

## 2022-06-14 DIAGNOSIS — M6281 Muscle weakness (generalized): Secondary | ICD-10-CM | POA: Diagnosis not present

## 2022-06-14 DIAGNOSIS — Z85828 Personal history of other malignant neoplasm of skin: Secondary | ICD-10-CM | POA: Diagnosis not present

## 2022-06-14 DIAGNOSIS — I10 Essential (primary) hypertension: Secondary | ICD-10-CM | POA: Diagnosis not present

## 2022-06-14 DIAGNOSIS — Z809 Family history of malignant neoplasm, unspecified: Secondary | ICD-10-CM | POA: Diagnosis not present

## 2022-06-14 DIAGNOSIS — R569 Unspecified convulsions: Secondary | ICD-10-CM | POA: Diagnosis not present

## 2022-06-14 DIAGNOSIS — Z7951 Long term (current) use of inhaled steroids: Secondary | ICD-10-CM | POA: Diagnosis not present

## 2022-06-14 DIAGNOSIS — R41841 Cognitive communication deficit: Secondary | ICD-10-CM | POA: Diagnosis not present

## 2022-06-14 DIAGNOSIS — Z79899 Other long term (current) drug therapy: Secondary | ICD-10-CM

## 2022-06-14 DIAGNOSIS — G629 Polyneuropathy, unspecified: Secondary | ICD-10-CM | POA: Diagnosis not present

## 2022-06-14 DIAGNOSIS — R2689 Other abnormalities of gait and mobility: Secondary | ICD-10-CM | POA: Diagnosis not present

## 2022-06-14 DIAGNOSIS — R54 Age-related physical debility: Secondary | ICD-10-CM | POA: Diagnosis not present

## 2022-06-14 DIAGNOSIS — J449 Chronic obstructive pulmonary disease, unspecified: Secondary | ICD-10-CM | POA: Diagnosis present

## 2022-06-14 DIAGNOSIS — Z96651 Presence of right artificial knee joint: Secondary | ICD-10-CM | POA: Diagnosis not present

## 2022-06-14 DIAGNOSIS — J45998 Other asthma: Secondary | ICD-10-CM | POA: Diagnosis not present

## 2022-06-14 DIAGNOSIS — M1711 Unilateral primary osteoarthritis, right knee: Principal | ICD-10-CM | POA: Diagnosis present

## 2022-06-14 DIAGNOSIS — Z6825 Body mass index (BMI) 25.0-25.9, adult: Secondary | ICD-10-CM

## 2022-06-14 DIAGNOSIS — Z8673 Personal history of transient ischemic attack (TIA), and cerebral infarction without residual deficits: Secondary | ICD-10-CM

## 2022-06-14 DIAGNOSIS — Z01818 Encounter for other preprocedural examination: Principal | ICD-10-CM

## 2022-06-14 DIAGNOSIS — K219 Gastro-esophageal reflux disease without esophagitis: Secondary | ICD-10-CM | POA: Diagnosis not present

## 2022-06-14 DIAGNOSIS — R531 Weakness: Secondary | ICD-10-CM | POA: Diagnosis not present

## 2022-06-14 DIAGNOSIS — I4891 Unspecified atrial fibrillation: Secondary | ICD-10-CM | POA: Diagnosis not present

## 2022-06-14 DIAGNOSIS — I2510 Atherosclerotic heart disease of native coronary artery without angina pectoris: Secondary | ICD-10-CM | POA: Diagnosis not present

## 2022-06-14 DIAGNOSIS — E669 Obesity, unspecified: Secondary | ICD-10-CM | POA: Diagnosis not present

## 2022-06-14 DIAGNOSIS — M25761 Osteophyte, right knee: Secondary | ICD-10-CM | POA: Diagnosis present

## 2022-06-14 HISTORY — PX: TOTAL KNEE ARTHROPLASTY: SHX125

## 2022-06-14 LAB — ABO/RH: ABO/RH(D): O POS

## 2022-06-14 SURGERY — ARTHROPLASTY, KNEE, TOTAL
Anesthesia: Spinal | Site: Knee | Laterality: Right

## 2022-06-14 MED ORDER — OXYCODONE HCL 5 MG PO TABS
10.0000 mg | ORAL_TABLET | ORAL | Status: DC | PRN
  Administered 2022-06-14: 10 mg via ORAL

## 2022-06-14 MED ORDER — SODIUM CHLORIDE 0.9 % IV SOLN
INTRAVENOUS | Status: DC
  Administered 2022-06-14: 1000 mL via INTRAVENOUS

## 2022-06-14 MED ORDER — PHENYLEPHRINE HCL-NACL 20-0.9 MG/250ML-% IV SOLN
INTRAVENOUS | Status: DC | PRN
  Administered 2022-06-14: 50 ug/min via INTRAVENOUS

## 2022-06-14 MED ORDER — DEXAMETHASONE SODIUM PHOSPHATE 10 MG/ML IJ SOLN
8.0000 mg | Freq: Once | INTRAMUSCULAR | Status: AC
  Administered 2022-06-14: 8 mg via INTRAVENOUS

## 2022-06-14 MED ORDER — ROPIVACAINE HCL 5 MG/ML IJ SOLN
INTRAMUSCULAR | Status: DC | PRN
  Administered 2022-06-14: 20 mL via PERINEURAL

## 2022-06-14 MED ORDER — METHOCARBAMOL 500 MG IVPB - SIMPLE MED
500.0000 mg | Freq: Four times a day (QID) | INTRAVENOUS | Status: DC | PRN
  Administered 2022-06-14: 500 mg via INTRAVENOUS

## 2022-06-14 MED ORDER — DIPHENHYDRAMINE HCL 12.5 MG/5ML PO ELIX: 12.5000 mg | ORAL_SOLUTION | ORAL | Status: DC | PRN

## 2022-06-14 MED ORDER — ONDANSETRON HCL 4 MG/2ML IJ SOLN
INTRAMUSCULAR | Status: AC
  Filled 2022-06-14: qty 2

## 2022-06-14 MED ORDER — TRAMADOL HCL 50 MG PO TABS: 50.0000 mg | ORAL_TABLET | Freq: Four times a day (QID) | ORAL | 0 refills | Status: DC | PRN

## 2022-06-14 MED ORDER — ASPIRIN 81 MG PO TBEC: 81.0000 mg | DELAYED_RELEASE_TABLET | Freq: Two times a day (BID) | ORAL | 0 refills | Status: AC

## 2022-06-14 MED ORDER — ONDANSETRON HCL 4 MG PO TABS: 4.0000 mg | ORAL_TABLET | Freq: Every day | ORAL | 1 refills | Status: AC | PRN

## 2022-06-14 MED ORDER — METHOCARBAMOL 500 MG PO TABS
500.0000 mg | ORAL_TABLET | Freq: Four times a day (QID) | ORAL | Status: DC | PRN
  Administered 2022-06-15 – 2022-06-19 (×7): 500 mg via ORAL
  Filled 2022-06-14 (×7): qty 1

## 2022-06-14 MED ORDER — HYDROMORPHONE HCL 1 MG/ML IJ SOLN: 0.5000 mg | INTRAMUSCULAR | Status: DC | PRN

## 2022-06-14 MED ORDER — CEFAZOLIN SODIUM-DEXTROSE 1-4 GM/50ML-% IV SOLN: 1.0000 g | Freq: Four times a day (QID) | INTRAVENOUS | Status: DC

## 2022-06-14 MED ORDER — ASPIRIN 81 MG PO CHEW
81.0000 mg | CHEWABLE_TABLET | Freq: Two times a day (BID) | ORAL | Status: DC
  Administered 2022-06-15 – 2022-06-19 (×9): 81 mg via ORAL
  Filled 2022-06-14 (×9): qty 1

## 2022-06-14 MED ORDER — SENNOSIDES-DOCUSATE SODIUM 8.6-50 MG PO TABS
1.0000 | ORAL_TABLET | Freq: Every evening | ORAL | Status: DC | PRN
  Administered 2022-06-18: 1 via ORAL
  Filled 2022-06-14: qty 1

## 2022-06-14 MED ORDER — PHENYLEPHRINE HCL-NACL 20-0.9 MG/250ML-% IV SOLN
INTRAVENOUS | Status: AC
  Filled 2022-06-14: qty 250

## 2022-06-14 MED ORDER — ALBUTEROL SULFATE (2.5 MG/3ML) 0.083% IN NEBU: 3.0000 mL | INHALATION_SOLUTION | Freq: Four times a day (QID) | RESPIRATORY_TRACT | Status: DC | PRN

## 2022-06-14 MED ORDER — GABAPENTIN 300 MG PO CAPS
300.0000 mg | ORAL_CAPSULE | Freq: Three times a day (TID) | ORAL | Status: DC
  Administered 2022-06-14 – 2022-06-19 (×16): 300 mg via ORAL
  Filled 2022-06-14 (×16): qty 1

## 2022-06-14 MED ORDER — METHOCARBAMOL 500 MG PO TABS: 500.0000 mg | ORAL_TABLET | Freq: Four times a day (QID) | ORAL | 0 refills | Status: DC

## 2022-06-14 MED ORDER — EPHEDRINE 5 MG/ML INJ
INTRAVENOUS | Status: AC
  Filled 2022-06-14: qty 5

## 2022-06-14 MED ORDER — METOPROLOL SUCCINATE ER 100 MG PO TB24
100.0000 mg | ORAL_TABLET | Freq: Every morning | ORAL | Status: DC
  Administered 2022-06-15 – 2022-06-19 (×5): 100 mg via ORAL
  Filled 2022-06-14 (×6): qty 1

## 2022-06-14 MED ORDER — LACTATED RINGERS IV SOLN: INTRAVENOUS | Status: DC

## 2022-06-14 MED ORDER — BISACODYL 5 MG PO TBEC
5.0000 mg | DELAYED_RELEASE_TABLET | Freq: Every day | ORAL | Status: DC | PRN
  Administered 2022-06-17 – 2022-06-19 (×2): 5 mg via ORAL
  Filled 2022-06-14 (×2): qty 1

## 2022-06-14 MED ORDER — BUPIVACAINE LIPOSOME 1.3 % IJ SUSP
INTRAMUSCULAR | Status: AC
  Filled 2022-06-14: qty 20

## 2022-06-14 MED ORDER — ONDANSETRON HCL 4 MG PO TABS: 4.0000 mg | ORAL_TABLET | Freq: Four times a day (QID) | ORAL | Status: DC | PRN

## 2022-06-14 MED ORDER — OXYCODONE HCL 5 MG PO TABS: 5.0000 mg | ORAL_TABLET | ORAL | 0 refills | Status: DC | PRN

## 2022-06-14 MED ORDER — ORAL CARE MOUTH RINSE: 15.0000 mL | Freq: Once | OROMUCOSAL | Status: AC

## 2022-06-14 MED ORDER — SODIUM CHLORIDE (PF) 0.9 % IJ SOLN: INTRAMUSCULAR | Status: DC | PRN

## 2022-06-14 MED ORDER — MAGNESIUM CITRATE PO SOLN: 1.0000 | Freq: Once | ORAL | Status: DC | PRN

## 2022-06-14 MED ORDER — OXYCODONE HCL 5 MG PO TABS
5.0000 mg | ORAL_TABLET | ORAL | Status: DC | PRN
  Administered 2022-06-15: 10 mg via ORAL
  Filled 2022-06-14 (×2): qty 2

## 2022-06-14 MED ORDER — SODIUM CHLORIDE 0.9 % IV SOLN
INTRAVENOUS | Status: DC | PRN
  Administered 2022-06-14: 80 mL

## 2022-06-14 MED ORDER — SODIUM CHLORIDE (PF) 0.9 % IJ SOLN
INTRAMUSCULAR | Status: AC
  Filled 2022-06-14: qty 50

## 2022-06-14 MED ORDER — PROPOFOL 10 MG/ML IV BOLUS
INTRAVENOUS | Status: DC | PRN
  Administered 2022-06-14 (×2): 20 mg via INTRAVENOUS
  Administered 2022-06-14: 30 mg via INTRAVENOUS

## 2022-06-14 MED ORDER — TRAMADOL HCL 50 MG PO TABS
50.0000 mg | ORAL_TABLET | Freq: Four times a day (QID) | ORAL | Status: DC
  Administered 2022-06-15 – 2022-06-19 (×14): 50 mg via ORAL
  Filled 2022-06-14 (×15): qty 1

## 2022-06-14 MED ORDER — POVIDONE-IODINE 10 % EX SWAB: 2.0000 | Freq: Once | CUTANEOUS | Status: DC

## 2022-06-14 MED ORDER — BUPIVACAINE IN DEXTROSE 0.75-8.25 % IT SOLN
INTRATHECAL | Status: DC | PRN
  Administered 2022-06-14: 1.6 mL via INTRATHECAL

## 2022-06-14 MED ORDER — PROPOFOL 1000 MG/100ML IV EMUL
INTRAVENOUS | Status: AC
  Filled 2022-06-14: qty 100

## 2022-06-14 MED ORDER — ACETAMINOPHEN 325 MG PO TABS
325.0000 mg | ORAL_TABLET | Freq: Four times a day (QID) | ORAL | Status: DC | PRN
  Administered 2022-06-15 – 2022-06-16 (×2): 650 mg via ORAL
  Administered 2022-06-16 (×2): 325 mg via ORAL
  Administered 2022-06-17 (×2): 650 mg via ORAL
  Administered 2022-06-17: 325 mg via ORAL
  Administered 2022-06-18: 650 mg via ORAL
  Filled 2022-06-14 (×3): qty 2
  Filled 2022-06-14 (×2): qty 1
  Filled 2022-06-14: qty 2
  Filled 2022-06-14: qty 1
  Filled 2022-06-14: qty 2

## 2022-06-14 MED ORDER — EPHEDRINE SULFATE-NACL 50-0.9 MG/10ML-% IV SOSY
PREFILLED_SYRINGE | INTRAVENOUS | Status: DC | PRN
  Administered 2022-06-14: 5 mg via INTRAVENOUS

## 2022-06-14 MED ORDER — HYDROMORPHONE HCL 1 MG/ML IJ SOLN
0.2500 mg | INTRAMUSCULAR | Status: DC | PRN
  Administered 2022-06-14 (×2): 0.5 mg via INTRAVENOUS

## 2022-06-14 MED ORDER — SODIUM CHLORIDE (PF) 0.9 % IJ SOLN
INTRAMUSCULAR | Status: AC
  Filled 2022-06-14: qty 10

## 2022-06-14 MED ORDER — DEXAMETHASONE SODIUM PHOSPHATE 10 MG/ML IJ SOLN
INTRAMUSCULAR | Status: AC
  Filled 2022-06-14: qty 1

## 2022-06-14 MED ORDER — METHOCARBAMOL 500 MG IVPB - SIMPLE MED
INTRAVENOUS | Status: AC
  Filled 2022-06-14: qty 55

## 2022-06-14 MED ORDER — FLUTICASONE FUROATE-VILANTEROL 100-25 MCG/ACT IN AEPB
1.0000 | INHALATION_SPRAY | Freq: Every day | RESPIRATORY_TRACT | Status: DC
  Administered 2022-06-15 – 2022-06-19 (×5): 1 via RESPIRATORY_TRACT
  Filled 2022-06-14: qty 28

## 2022-06-14 MED ORDER — UMECLIDINIUM BROMIDE 62.5 MCG/ACT IN AEPB
1.0000 | INHALATION_SPRAY | Freq: Every day | RESPIRATORY_TRACT | Status: DC
  Administered 2022-06-15 – 2022-06-19 (×5): 1 via RESPIRATORY_TRACT
  Filled 2022-06-14: qty 7

## 2022-06-14 MED ORDER — CHLORHEXIDINE GLUCONATE 0.12 % MT SOLN
15.0000 mL | Freq: Once | OROMUCOSAL | Status: AC
  Administered 2022-06-14: 15 mL via OROMUCOSAL

## 2022-06-14 MED ORDER — HYDROMORPHONE HCL 1 MG/ML IJ SOLN
INTRAMUSCULAR | Status: AC
  Filled 2022-06-14: qty 2

## 2022-06-14 MED ORDER — CEFAZOLIN SODIUM-DEXTROSE 1-4 GM/50ML-% IV SOLN
1.0000 g | Freq: Four times a day (QID) | INTRAVENOUS | Status: AC
  Administered 2022-06-14 – 2022-06-15 (×3): 1 g via INTRAVENOUS
  Filled 2022-06-14 (×3): qty 50

## 2022-06-14 MED ORDER — SODIUM CHLORIDE 0.9 % IR SOLN
Status: DC | PRN
  Administered 2022-06-14: 1000 mL

## 2022-06-14 MED ORDER — FENTANYL CITRATE (PF) 100 MCG/2ML IJ SOLN
INTRAMUSCULAR | Status: DC | PRN
  Administered 2022-06-14: 50 ug via INTRAVENOUS

## 2022-06-14 MED ORDER — ACETAMINOPHEN 10 MG/ML IV SOLN: 1000.0000 mg | Freq: Once | INTRAVENOUS | Status: DC | PRN

## 2022-06-14 MED ORDER — TRANEXAMIC ACID-NACL 1000-0.7 MG/100ML-% IV SOLN
1000.0000 mg | INTRAVENOUS | Status: DC
  Filled 2022-06-14: qty 100

## 2022-06-14 MED ORDER — BUPIVACAINE LIPOSOME 1.3 % IJ SUSP: 20.0000 mL | Freq: Once | INTRAMUSCULAR | Status: DC

## 2022-06-14 MED ORDER — PROPOFOL 500 MG/50ML IV EMUL
INTRAVENOUS | Status: DC | PRN
  Administered 2022-06-14: 100 ug/kg/min via INTRAVENOUS

## 2022-06-14 MED ORDER — ACETAMINOPHEN 500 MG PO TABS
1000.0000 mg | ORAL_TABLET | Freq: Four times a day (QID) | ORAL | Status: AC
  Administered 2022-06-14 – 2022-06-15 (×4): 1000 mg via ORAL
  Filled 2022-06-14 (×4): qty 2

## 2022-06-14 MED ORDER — ONDANSETRON HCL 4 MG/2ML IJ SOLN
INTRAMUSCULAR | Status: DC | PRN
  Administered 2022-06-14: 4 mg via INTRAVENOUS

## 2022-06-14 MED ORDER — POTASSIUM CHLORIDE CRYS ER 10 MEQ PO TBCR
10.0000 meq | EXTENDED_RELEASE_TABLET | Freq: Every day | ORAL | Status: DC
  Administered 2022-06-15 – 2022-06-19 (×5): 10 meq via ORAL
  Filled 2022-06-14 (×5): qty 1

## 2022-06-14 MED ORDER — FENTANYL CITRATE (PF) 100 MCG/2ML IJ SOLN
INTRAMUSCULAR | Status: AC
  Filled 2022-06-14: qty 2

## 2022-06-14 MED ORDER — AMLODIPINE BESYLATE 5 MG PO TABS
5.0000 mg | ORAL_TABLET | Freq: Every morning | ORAL | Status: DC
  Administered 2022-06-15 – 2022-06-19 (×5): 5 mg via ORAL
  Filled 2022-06-14 (×5): qty 1

## 2022-06-14 MED ORDER — 0.9 % SODIUM CHLORIDE (POUR BTL) OPTIME
TOPICAL | Status: DC | PRN
  Administered 2022-06-14: 1000 mL

## 2022-06-14 MED ORDER — CEFAZOLIN SODIUM-DEXTROSE 2-4 GM/100ML-% IV SOLN
2.0000 g | INTRAVENOUS | Status: AC
  Administered 2022-06-14: 2 g via INTRAVENOUS
  Filled 2022-06-14: qty 100

## 2022-06-14 MED ORDER — ONDANSETRON HCL 4 MG/2ML IJ SOLN: 4.0000 mg | Freq: Four times a day (QID) | INTRAMUSCULAR | Status: DC | PRN

## 2022-06-14 SURGICAL SUPPLY — 52 items
ATTUNE MED DOME PAT 32 KNEE (Knees) IMPLANT
ATTUNE PSFEM RTSZ5 NARCEM KNEE (Femur) IMPLANT
ATTUNE PSRP INSE SZ5 7 KNEE (Insert) IMPLANT
BAG COUNTER SPONGE SURGICOUNT (BAG) ×1 IMPLANT
BAG ZIPLOCK 12X15 (MISCELLANEOUS) ×1 IMPLANT
BASE TIBIAL ROT PLAT SZ 3 KNEE (Knees) IMPLANT
BLADE SAG 18X100X1.27 (BLADE) ×1 IMPLANT
BLADE SAW SGTL 11.0X1.19X90.0M (BLADE) ×1 IMPLANT
BNDG ELASTIC 4X5.8 VLCR STR LF (GAUZE/BANDAGES/DRESSINGS) ×1 IMPLANT
BNDG ELASTIC 6X5.8 VLCR STR LF (GAUZE/BANDAGES/DRESSINGS) ×1 IMPLANT
BOWL SMART MIX CTS (DISPOSABLE) ×1 IMPLANT
CEMENT HV SMART SET (Cement) IMPLANT
COVER SURGICAL LIGHT HANDLE (MISCELLANEOUS) ×1 IMPLANT
CUFF TOURN SGL QUICK 34 (TOURNIQUET CUFF) ×1
CUFF TRNQT CYL 34X4.125X (TOURNIQUET CUFF) ×1 IMPLANT
DERMABOND ADVANCED .7 DNX12 (GAUZE/BANDAGES/DRESSINGS) ×1 IMPLANT
DRAPE INCISE IOBAN 66X45 STRL (DRAPES) ×1 IMPLANT
DRAPE U-SHAPE 47X51 STRL (DRAPES) ×1 IMPLANT
DRSG AQUACEL AG ADV 3.5X10 (GAUZE/BANDAGES/DRESSINGS) ×1 IMPLANT
DURAPREP 26ML APPLICATOR (WOUND CARE) ×2 IMPLANT
ELECT REM PT RETURN 15FT ADLT (MISCELLANEOUS) ×1 IMPLANT
GLOVE BIOGEL PI IND STRL 7.5 (GLOVE) ×1 IMPLANT
GLOVE BIOGEL PI IND STRL 8 (GLOVE) ×1 IMPLANT
GLOVE ECLIPSE 8.0 STRL XLNG CF (GLOVE) ×1 IMPLANT
GLOVE SURG ORTHO 9.0 STRL STRW (GLOVE) ×1 IMPLANT
GLOVE SURG SS PI 7.0 STRL IVOR (GLOVE) ×1 IMPLANT
GOWN SPEC L4 XLG W/TWL (GOWN DISPOSABLE) ×2 IMPLANT
HANDPIECE INTERPULSE COAX TIP (DISPOSABLE) ×1
KIT TURNOVER KIT A (KITS) IMPLANT
NS IRRIG 1000ML POUR BTL (IV SOLUTION) ×1 IMPLANT
PACK TOTAL KNEE CUSTOM (KITS) ×1 IMPLANT
PIN STEINMAN FIXATION KNEE (PIN) IMPLANT
PROTECTOR NERVE ULNAR (MISCELLANEOUS) ×1 IMPLANT
SET HNDPC FAN SPRY TIP SCT (DISPOSABLE) ×1 IMPLANT
SET PAD KNEE POSITIONER (MISCELLANEOUS) ×1 IMPLANT
SOLUTION PRONTOSAN WOUND 350ML (IRRIGATION / IRRIGATOR) ×1 IMPLANT
SPIKE FLUID TRANSFER (MISCELLANEOUS) ×1 IMPLANT
SPONGE SURGIFOAM ABS GEL 100 (HEMOSTASIS) ×1 IMPLANT
STOCKINETTE 6  STRL (DRAPES) ×1
STOCKINETTE 6 STRL (DRAPES) ×1 IMPLANT
SUT MNCRL AB 3-0 PS2 18 (SUTURE) ×1 IMPLANT
SUT VIC AB 1 CT1 27 (SUTURE) ×3
SUT VIC AB 1 CT1 27XBRD ANTBC (SUTURE) ×3 IMPLANT
SUT VIC AB 2-0 CT1 27 (SUTURE) ×2
SUT VIC AB 2-0 CT1 TAPERPNT 27 (SUTURE) ×2 IMPLANT
SUT VLOC 180 0 24IN GS25 (SUTURE) ×1 IMPLANT
SYR 50ML LL SCALE MARK (SYRINGE) ×1 IMPLANT
TAPE STRIPS DRAPE STRL (GAUZE/BANDAGES/DRESSINGS) ×1 IMPLANT
TIBIAL BASE ROT PLAT SZ 3 KNEE (Knees) ×1 IMPLANT
TRAY CATH INTERMITTENT SS 16FR (CATHETERS) ×1 IMPLANT
WATER STERILE IRR 1000ML POUR (IV SOLUTION) ×2 IMPLANT
WRAP KNEE MAXI GEL POST OP (GAUZE/BANDAGES/DRESSINGS) IMPLANT

## 2022-06-14 NOTE — Evaluation (Signed)
Physical Therapy Evaluation Patient Details Name: Alisha Murphy MRN: [id number] DOB: 09-12-1934 Today's Date: 06/14/2022  History of Present Illness  87 y.o. female admitted for R TKA 06/14/22. PMH: skin cancer, CVA, acute renal failure, lumbar compression fx.  Clinical Impression  Pt admitted with above diagnosis. Pt ambulated 58' with RW, no loss of balance. SaO2 88% on room air after walking, 94% on 2L O2 after 2 minutes seated rest.  Pt currently with functional limitations due to the deficits listed below (see PT Problem List). Pt will benefit from skilled PT to increase their independence and safety with mobility to allow discharge to the venue listed below.          Recommendations for follow up therapy are one component of a multi-disciplinary discharge planning process, led by the attending physician.  Recommendations may be updated based on patient status, additional functional criteria and insurance authorization.  Follow Up Recommendations Skilled nursing-short term rehab (<3 hours/day) Can patient physically be transported by private vehicle: No    Assistance Recommended at Discharge Frequent or constant Supervision/Assistance  Patient can return home with the following  A little help with walking and/or transfers;A lot of help with bathing/dressing/bathroom;Assistance with cooking/housework;Direct supervision/assist for medications management;Help with stairs or ramp for entrance;Assist for transportation    Equipment Recommendations None recommended by PT  Recommendations for Other Services       Functional Status Assessment Patient has had a recent decline in their functional status and demonstrates the ability to make significant improvements in function in a reasonable and predictable amount of time.     Precautions / Restrictions Precautions Precautions: Fall;Knee Precaution Comments: 2 falls in past 6 months Restrictions Weight Bearing Restrictions: No Other  Position/Activity Restrictions: WBAT      Mobility  Bed Mobility Overal bed mobility: Needs Assistance Bed Mobility: Supine to Sit     Supine to sit: Min assist, HOB elevated     General bed mobility comments: min A to raise trunk, used rail    Transfers Overall transfer level: Needs assistance Equipment used: Rolling walker (2 wheels) Transfers: Sit to/from Stand Sit to Stand: Min assist, From elevated surface           General transfer comment: VCs hand placement, min A to power up    Ambulation/Gait Ambulation/Gait assistance: Min guard Gait Distance (Feet): 35 Feet Assistive device: Rolling walker (2 wheels) Gait Pattern/deviations: Step-to pattern, Decreased step length - right, Decreased step length - left Gait velocity: decr     General Gait Details: VCs sequencing, no loss of balance, no buckling SaO2 88% on room air just after walking, 94% on 2L after 2 min seated rest Stairs            Wheelchair Mobility    Modified Rankin (Stroke Patients Only)       Balance Overall balance assessment: Needs assistance, History of Falls   Sitting balance-Leahy Scale: Good     Standing balance support: Bilateral upper extremity supported Standing balance-Leahy Scale: Poor                               Pertinent Vitals/Pain Pain Assessment Pain Assessment: 0-10 Pain Score: 4  Pain Location: R knee Pain Descriptors / Indicators: Operative site guarding Pain Intervention(s): Limited activity within patient's tolerance, Monitored during session, Premedicated before session, Ice applied    Home Living Family/patient expects to be discharged to:: Skilled nursing facility [facility]) Living  Arrangements: Alone Available Help at Discharge: Family Type of Home: House Home Access: Stairs to enter   [employer] of Steps: 3   Home Layout: Laundry or work area in [place]: Deal (4 wheels);Rolling Walker (2  wheels);Cane - single point Additional Comments: plans to DC to SNF, then son will assist at home    Prior Function Prior Level of Function : Independent/Modified Independent;History of Falls (last six months)             Mobility Comments: short distances with rollator       Hand Dominance   Dominant Hand: Left    Extremity/Trunk Assessment   Upper Extremity Assessment Upper Extremity Assessment: Overall WFL for tasks assessed    Lower Extremity Assessment Lower Extremity Assessment: RLE deficits/detail RLE Deficits / Details: knee ext at least 3/5, SLR 3/5 RLE Sensation: WNL    Cervical / Trunk Assessment Cervical / Trunk Assessment: Kyphotic  Communication   Communication: No difficulties  Cognition Arousal/Alertness: Awake/alert Behavior During Therapy: WFL for tasks assessed/performed Overall Cognitive Status: Within Functional Limits for tasks assessed                                          General Comments      Exercises Total Joint Exercises Ankle Circles/Pumps: AROM, Both, 10 reps, Supine   Assessment/Plan    PT Assessment Patient needs continued PT services  PT Problem List Decreased strength;Decreased range of motion;Pain       PT Treatment Interventions DME instruction;Gait training;Therapeutic exercise;Patient/family education;Therapeutic activities;Functional mobility training    PT Goals (Current goals can be found in the Care Plan section)  Acute Rehab PT Goals Patient Stated Goal: ST-SNF PT Goal Formulation: With patient/family Time For Goal Achievement: 06/21/22 Potential to Achieve Goals: Good    Frequency 7X/week     Co-evaluation               AM-PAC PT "6 Clicks" Mobility  Outcome Measure Help needed turning from your back to your side while in a flat bed without using bedrails?: A Little Help needed moving from lying on your back to sitting on the side of a flat bed without using bedrails?: A  Lot Help needed moving to and from a bed to a chair (including a wheelchair)?: A Little Help needed standing up from a chair using your arms (e.g., wheelchair or bedside chair)?: A Little Help needed to walk in hospital room?: A Little Help needed climbing 3-5 steps with a railing? : Total 6 Click Score: 15    End of Session Equipment Utilized During Treatment: Gait belt Activity Tolerance: Patient tolerated treatment well Patient left: in chair;with call bell/phone within reach;with chair alarm set Nurse Communication: Mobility status PT Visit Diagnosis: Difficulty in walking, not elsewhere classified (R26.2);Pain Pain - Right/Left: Right Pain - part of body: Knee    Time: 5852-7782 PT Time Calculation (min) (ACUTE ONLY): 20 min   Charges:   PT Evaluation $PT Eval Moderate Complexity: 1 Mod          Philomena Doheny PT 06/14/2022  Acute Rehabilitation Services  Office [phone]

## 2022-06-14 NOTE — Transfer of Care (Signed)
Immediate Anesthesia Transfer of Care Note  Patient: Alisha Murphy  Procedure(s) Performed: TOTAL KNEE ARTHROPLASTY (Right: Knee)  Patient Location: PACU  Anesthesia Type:Regional and Spinal  Level of Consciousness: awake, alert , and oriented  Airway & Oxygen Therapy: Patient Spontanous Breathing and Patient connected to face mask oxygen  Post-op Assessment: Report given to RN and Post -op Vital signs reviewed and stable  Post vital signs: Reviewed and stable  Last Vitals:  Vitals Value Taken Time  BP 111/63 06/14/22 1024  Temp 36.6 C 06/14/22 1024  Pulse 44 06/14/22 1025  Resp 21 06/14/22 1025  SpO2 97 % 06/14/22 1025  Vitals shown include unvalidated device data.  Last Pain:  Vitals:   06/14/22 0539  TempSrc: Oral         Complications: No notable events documented.

## 2022-06-14 NOTE — Anesthesia Preprocedure Evaluation (Signed)
Anesthesia Evaluation  Patient identified by MRN, date of birth, ID band Patient awake    Reviewed: Allergy & Precautions, NPO status , Patient's Chart, lab work & pertinent test results  Airway Mallampati: II  TM Distance: >3 FB Neck ROM: Full    Dental  (+) Dental Advisory Given   Pulmonary COPD   breath sounds clear to auscultation       Cardiovascular hypertension, Pt. on medications and Pt. on home beta blockers + dysrhythmias Atrial Fibrillation  Rhythm:Regular Rate:Normal     Neuro/Psych CVA  negative psych ROS   GI/Hepatic Neg liver ROS,GERD  ,,  Endo/Other  negative endocrine ROS    Renal/GU negative Renal ROS     Musculoskeletal  (+) Arthritis ,    Abdominal   Peds  Hematology negative hematology ROS (+)   Anesthesia Other Findings   Reproductive/Obstetrics                              Anesthesia Physical Anesthesia Plan  ASA: 3  Anesthesia Plan: Spinal   Post-op Pain Management: Regional block*   Induction: Intravenous  PONV Risk Score and Plan: 0 and Propofol infusion and Ondansetron  Airway Management Planned: Natural Airway and Simple Face Mask  Additional Equipment: None  Intra-op Plan:   Post-operative Plan:   Informed Consent: I have reviewed the patients History and Physical, chart, labs and discussed the procedure including the risks, benefits and alternatives for the proposed anesthesia with the patient or authorized representative who has indicated his/her understanding and acceptance.       Plan Discussed with: CRNA  Anesthesia Plan Comments: (Lab Results      Component                Value               Date                      WBC                      6.0                 06/02/2022                HGB                      13.4                06/02/2022                HCT                      41.7                06/02/2022                MCV                       101.7 (H)           06/02/2022                PLT                      190  06/02/2022           )         Anesthesia Quick Evaluation

## 2022-06-14 NOTE — Op Note (Signed)
DATE OF SURGERY:  06/14/2022  TIME: 10:20 AM  PATIENT NAME:  Alisha Murphy    AGE: 87 y.o.   PRE-OPERATIVE DIAGNOSIS:  Right knee osteoarthritis  POST-OPERATIVE DIAGNOSIS:  Right knee osteoarthritis  PROCEDURE:  Procedure(s): TOTAL KNEE ARTHROPLASTY  SURGEON:  Jasie Meleski ANDREW  ASSISTANT:  Leeanne Haus, PA-C, present and scrubbed throughout the case, critical for assistance with exposure, retraction, instrumentation, and closure.  OPERATIVE IMPLANTS: Depuy PFC Attune Rotating Platform.  Femur size 5, Tibia size 3, Patella size 32 3-peg oval button, with a 7 mm polyethylene insert.   PREOPERATIVE INDICATIONS:   Alisha Murphy is a 87 y.o. year old female with end stage bone on bone arthritis of the knee who failed conservative treatment and elected for Total Knee Arthroplasty.   The risks, benefits, and alternatives were discussed at length including but not limited to the risks of infection, bleeding, nerve injury, stiffness, blood clots, the need for revision surgery, cardiopulmonary complications, among others, and they were willing to proceed.  OPERATIVE DESCRIPTION:  The patient was brought to the operative room and placed in a supine position.  Spinal anesthesia was administered.  IV antibiotics were given.  The lower extremity was prepped and draped in the usual sterile fashion.  Time out was performed.  The leg was elevated and exsanguinated and the tourniquet was inflated.  Anterior quadriceps tendon splitting approach was performed.  The patella was retracted and osteophytes were removed.  The anterior horn of the medial and lateral meniscus was removed and cruciate ligaments resected.   The distal femur was opened with the drill and the intramedullary distal femoral cutting jig was utilized, set at 5 degrees resecting 10 mm off the distal femur.  Care was taken to protect the collateral ligaments.  The distal femoral sizing jig was applied, taking care to avoid  notching.  Then the 4-in-1 cutting jig was applied and the anterior and posterior femur was cut, along with the chamfer cuts.    Then the extramedullary tibial cutting jig was utilized making the appropriate cut using the anterior tibial crest as a reference building in appropriate posterior slope.  Care was taken during the cut to protect the medial and collateral ligaments.  The proximal tibia was removed along with the posterior horns of the menisci.   The posterior medial femoral osteophytes and posterior lateral femoral osteophytes were removed.    The flexion gap was then measured and was symmetric with the extension gap, measured at 7.  I completed the distal femoral preparation using the appropriate jig to prepare the box.  The patella was then measured, and cut with the saw.    The proximal tibia sized and prepared accordingly with the reamer and the punch, and then all components were trialed with the trial insert.  The knee was found to have excellent balance and full motion.    The above named components were then cemented into place and all excess cement was removed.  The trial polyethylene component was in place during cementation, and then was exchanged for the real polyethylene component.    The knee was easily taken through a range of motion and the patella tracked well and the knee irrigated copiously and the parapatellar and subcutaneous tissue closed with vicryl, and monocryl with steri strips for the skin.  The arthrotomy was closed at 90 of flexion. The wounds were dressed with sterile gauze and the tourniquet released and the patient was awakened and returned to the PACU in  stable and satisfactory condition.  There were no complications.  Total tourniquet time was 80 minutes.  Patient has a known THR on the ipsilateral side and care and caution was given to this. At the end of the case the hip was examined and found to be intact and located.

## 2022-06-14 NOTE — Anesthesia Postprocedure Evaluation (Signed)
Anesthesia Post Note  Patient: Alisha Murphy  Procedure(s) Performed: TOTAL KNEE ARTHROPLASTY (Right: Knee)     Patient location during evaluation: PACU Anesthesia Type: Spinal Level of consciousness: oriented and awake and alert Pain management: pain level controlled Vital Signs Assessment: post-procedure vital signs reviewed and stable Respiratory status: spontaneous breathing, respiratory function stable and patient connected to nasal cannula oxygen Cardiovascular status: blood pressure returned to baseline and stable Postop Assessment: no headache, no backache and no apparent nausea or vomiting Anesthetic complications: no   No notable events documented.  Last Vitals:  Vitals:   06/14/22 1145 06/14/22 1209  BP: 105/83 (!) 114/52  Pulse: (!) 53 96  Resp: 18 16  Temp: (!) 36.4 C 36.4 C  SpO2: 95% 93%    Last Pain:  Vitals:   06/14/22 1209  TempSrc: Oral  PainSc: 2                  [name]

## 2022-06-14 NOTE — Anesthesia Procedure Notes (Signed)
Spinal  Patient location during procedure: OR End time: 06/14/2022 8:10 AM Reason for block: surgical anesthesia Staffing Performed: resident/CRNA  Resident/CRNA: Noralyn Pick D, CRNA Performed by: Loi Rennaker, Clinical cytogeneticist D, CRNA Authorized by: [name], MD   Preanesthetic Checklist Completed: patient identified, IV checked, site marked, risks and benefits discussed, surgical consent, monitors and equipment checked, pre-op evaluation and timeout performed Spinal Block Patient position: sitting Prep: DuraPrep Patient monitoring: heart rate, continuous pulse ox and blood pressure Approach: right paramedian Location: L3-4 Injection technique: single-shot Needle Needle type: Pencan  Needle gauge: 24 G Needle length: 9 cm Assessment Sensory level: T6 Events: CSF return

## 2022-06-14 NOTE — Interval H&P Note (Signed)
History and Physical Interval Note:  06/14/2022 7:55 AM  Alisha Murphy  has presented today for surgery, with the diagnosis of Right knee osteoarthritis.  The various methods of treatment have been discussed with the patient and family. After consideration of risks, benefits and other options for treatment, the patient has consented to  Procedure(s) with comments: TOTAL KNEE ARTHROPLASTY (Right) - adductor canal as a surgical intervention.  The patient's history has been reviewed, patient examined, no change in status, stable for surgery.  I have reviewed the patient's chart and labs.  Questions were answered to the patient's satisfaction.     Alisha Murphy ANDREW

## 2022-06-14 NOTE — Anesthesia Procedure Notes (Signed)
Anesthesia Regional Block: Adductor canal block   Pre-Anesthetic Checklist: , timeout performed,  Correct Patient, Correct Site, Correct Laterality,  Correct Procedure, Correct Position, site marked,  Risks and benefits discussed,  Surgical consent,  Pre-op evaluation,  At surgeon's request and post-op pain management  Laterality: Right  Prep: chloraprep       Needles:  Injection technique: Single-shot  Needle Type: Echogenic Stimulator Needle     Needle Length: 9cm  Needle Gauge: 21     Additional Needles:   Procedures:,,,, ultrasound used (permanent image in chart),,    Narrative:  Start time: 06/14/2022 7:15 AM End time: 06/14/2022 7:20 AM Injection made incrementally with aspirations every 5 mL.  Performed by: Personally  Anesthesiologist: [name], MD  Additional Notes: Discussed risks and benefits of the nerve block in detail, including but not limited vascular injury, permanent nerve damage and infection.   Patient tolerated the procedure well. Local anesthetic introduced in an incremental fashion under minimal resistance after negative aspirations. No paresthesias were elicited. After completion of the procedure, no acute issues were identified and patient continued to be monitored by RN.

## 2022-06-15 MED ORDER — SALINE SPRAY 0.65 % NA SOLN
1.0000 | NASAL | Status: DC | PRN
  Filled 2022-06-15: qty 44

## 2022-06-15 NOTE — Progress Notes (Signed)
Patient ID: Alisha Murphy, female   DOB: [birth year], 87 y.o.   MRN: [id number] Subjective: 1 Day Post-Op Procedure(s) (LRB): TOTAL KNEE ARTHROPLASTY (Right)    Patient reports pain as moderate. No events noted overnight  Objective:   VITALS:   Vitals:   06/15/22 0523 06/15/22 0737  BP: (!) 136/55   Pulse: (!) 54   Resp: 17   Temp: 97.6 F (36.4 C)   SpO2: 95% 94%    Neurovascular intact Incision: dressing C/D/I ACE wrap removed, bruising noted around knee  LABS No results for input(s): "HGB", "HCT", "WBC", "PLT" in the last 72 hours.  No results for input(s): "NA", "K", "BUN", "CREATININE", "GLUCOSE" in the last 72 hours.  No results for input(s): "LABPT", "INR" in the last 72 hours.   Assessment/Plan: 1 Day Post-Op Procedure(s) (LRB): TOTAL KNEE ARTHROPLASTY (Right)   Advance diet Up with therapy Apparently the pre-op plan was for her to go to SNF Dispo pending PT evaluation

## 2022-06-15 NOTE — TOC Initial Note (Addendum)
Transition of Care [facility]) - Initial/Assessment Note    Patient Details  Name: Alisha Murphy MRN: [id number] Date of Birth: 1934/11/15  Transition of [facility] CM/SW Contact:    Henrietta Dine, RN Phone Number: 06/15/2022, 10:48 AM  Clinical Narrative:                 [facility] consult for SNF; pt confused overnight; spoke with her son Alisha Murphy ([phone]) and he agrees to SNF; he says the pt's preference is [facility] since she lives in [place]; he says the pt has reading glasses; FL2 completed and sent for MD co-sign; will send FL2 for offers; will also need ins auth.  Expected Discharge Plan: Skilled Nursing Facility Barriers to Discharge: Continued Medical Work up   Patient Goals and CMS Choice        Expected Discharge Plan and Services Expected Discharge Plan: [place]   Discharge Planning Services: CM Consult Post Acute Care Choice: [place] Living arrangements for the past 2 months: Single Family Home                                      Prior Living Arrangements/Services Living arrangements for the past 2 months: Single Family Home Lives with:: Self Patient language and need for interpreter reviewed:: Yes Do you feel safe going back to the place where you live?: Yes      Need for Family Participation in Patient Care: Yes (Comment) Care giver support system in place?: Yes (comment)   Criminal Activity/Legal Involvement Pertinent to Current Situation/Hospitalization: No - Comment as needed  Activities of Daily Living Home Assistive Devices/Equipment: Bedside commode/3-in-1, Walker (specify type), Eyeglasses ADL Screening (condition at time of admission) Patient's cognitive ability adequate to safely complete daily activities?: Yes Is the patient deaf or have difficulty hearing?: No Does the patient have difficulty seeing, even when wearing glasses/contacts?: No Does the patient have difficulty  concentrating, remembering, or making decisions?: No Patient able to express need for assistance with ADLs?: Yes Does the patient have difficulty dressing or bathing?: No Independently performs ADLs?: Yes (appropriate for developmental age) Does the patient have difficulty walking or climbing stairs?: Yes Weakness of Legs: Right Weakness of Arms/Hands: None  Permission Sought/Granted Permission sought to share information with : Case Manager Permission granted to share information with : Yes, Verbal Permission Granted  Share Information with NAME: Lenor Coffin, RN, CM           Emotional Assessment       Orientation: : Fluctuating Orientation (Suspected and/or reported Sund[place]) Alcohol / Substance Use: Not Applicable Psych Involvement: No (comment)  Admission diagnosis:  Osteoarthritis of right knee [M17.11] Patient Active Problem List   Diagnosis Date Noted   Osteoarthritis of right knee 06/14/2022   Unilateral primary osteoarthritis, right knee 01/19/2022   Lumbar compression fracture ([place]) 07/22/2020   Lumbar radiculopathy 06/16/2019   History of acute renal failure 06/16/2019   Left leg pain 06/16/2019   History of ischemic stroke 06/16/2019   GERD 04/26/2010   COLITIS 04/26/2010   ABDOMINAL PAIN, UNSPECIFIED SITE 04/26/2010   PUD, HX OF 04/26/2010   PCP:  Neale Burly, MD Pharmacy:   [place] [place] [address] [place] [address] Phone: [phone] Fax: [phone]     Social Determinants of Health (SDOH) Interventions  Readmission Risk Interventions     No data to display

## 2022-06-15 NOTE — NC FL2 (Signed)
[place] MEDICAID FL2 LEVEL OF CARE SCREENING TOOL     IDENTIFICATION  Patient Name: Alisha Murphy Birthdate: November 01, 1934 Sex: female Admission Date (Current Location): 06/14/2022  [facility] and [place] Number:  Herbalist and Address:  [facility],  [address], [place]      Provider Number: [id number]  Attending Physician Name and Address:  Sydnee Cabal, MD  Relative Name and Phone Number:  Kacia Halley (son) [phone]    Current Level of Care: Hospital Recommended Level of Care: [place] Prior Approval Number:    Date Approved/Denied:   PASRR Number: [id number]  Discharge Plan: SNF    Current Diagnoses: Patient Active Problem List   Diagnosis Date Noted   Osteoarthritis of right knee 06/14/2022   Unilateral primary osteoarthritis, right knee 01/19/2022   Lumbar compression fracture ([place]) 07/22/2020   Lumbar radiculopathy 06/16/2019   History of acute renal failure 06/16/2019   Left leg pain 06/16/2019   History of ischemic stroke 06/16/2019   GERD 04/26/2010   COLITIS 04/26/2010   ABDOMINAL PAIN, UNSPECIFIED SITE 04/26/2010   PUD, HX OF 04/26/2010    Orientation RESPIRATION BLADDER Height & Weight     Self  O2 (2L ) Continent Weight: 64.4 kg Height:  '5\' 2"'$  (157.5 cm)  BEHAVIORAL SYMPTOMS/MOOD NEUROLOGICAL BOWEL NUTRITION STATUS      Continent Diet (regular diet)  AMBULATORY STATUS COMMUNICATION OF NEEDS Skin   Limited Assist Verbally Surgical wounds (surgical incision Right knee)                       Personal Care Assistance Level of Assistance              Functional Limitations Info  Sight, Hearing, Speech Sight Info: Impaired (reading glasses) Hearing Info: Adequate Speech Info: Adequate    SPECIAL CARE FACTORS FREQUENCY  PT (By licensed PT), OT (By licensed OT)     PT Frequency: 5x/week OT Frequency: 5x/week            Contractures Contractures Info: Not present     Additional Factors Info  Code Status, Allergies Code Status Info: Full Allergies Info: Doxycyline           Current Medications (06/15/2022):  This is the current hospital active medication list Current Facility-Administered Medications  Medication Dose Route Frequency Provider Last Rate Last Admin   0.9 %  sodium chloride infusion   Intravenous Continuous Haus, Leeanne R, PA 50 mL/hr at 06/14/22 1139 1,000 mL at 06/14/22 1139   acetaminophen (TYLENOL) tablet 325-650 mg  325-650 mg Oral Q6H PRN Haus, Leeanne R, PA       albuterol (PROVENTIL) (2.5 MG/3ML) 0.083% nebulizer solution 3 mL  3 mL Inhalation Q6H PRN Haus, Leeanne R, PA       amLODipine (NORVASC) tablet 5 mg  5 mg Oral q AM Haus, Leeanne R, PA   5 mg at 06/15/22 0831   aspirin chewable tablet 81 mg  81 mg Oral BID Elizabeth Sauer R, PA   81 mg at 06/15/22 0831   bisacodyl (DULCOLAX) EC tablet 5 mg  5 mg Oral Daily PRN Haus, Leeanne R, PA       diphenhydrAMINE (BENADRYL) 12.5 MG/5ML elixir 12.5-25 mg  12.5-25 mg Oral Q4H PRN Haus, Leeanne R, PA       fluticasone furoate-vilanterol (BREO ELLIPTA) 100-25 MCG/ACT 1 puff  1 puff Inhalation Daily Haus, Leeanne R, PA   1 puff at  06/15/22 0735   And   umeclidinium bromide (INCRUSE ELLIPTA) 62.5 MCG/ACT 1 puff  1 puff Inhalation Daily Elizabeth Sauer R, PA   1 puff at 06/15/22 0736   gabapentin (NEURONTIN) capsule 300 mg  300 mg Oral TID Elizabeth Sauer R, PA   300 mg at 06/15/22 0831   HYDROmorphone (DILAUDID) injection 0.5-1 mg  0.5-1 mg Intravenous Q4H PRN Haus, Leeanne R, PA       magnesium citrate solution 1 Bottle  1 Bottle Oral Once PRN Haus, Leeanne R, PA       methocarbamol (ROBAXIN) tablet 500 mg  500 mg Oral Q6H PRN Haus, Leeanne R, PA   500 mg at 06/15/22 7209   Or   methocarbamol (ROBAXIN) 500 mg in dextrose 5 % 50 mL IVPB  500 mg Intravenous Q6H PRN Elizabeth Sauer R, PA   Stopped at 06/14/22 1211   metoprolol succinate (TOPROL-XL) 24 hr tablet 100 mg  100 mg Oral q AM Haus,  Leeanne R, PA   100 mg at 06/15/22 0853   ondansetron (ZOFRAN) tablet 4 mg  4 mg Oral Q6H PRN Haus, Leeanne R, PA       Or   ondansetron (ZOFRAN) injection 4 mg  4 mg Intravenous Q6H PRN Haus, Leeanne R, PA       oxyCODONE (Oxy IR/ROXICODONE) immediate release tablet 10-15 mg  10-15 mg Oral Q4H PRN Haus, Leeanne R, PA   10 mg at 06/14/22 1625   oxyCODONE (Oxy IR/ROXICODONE) immediate release tablet 5-10 mg  5-10 mg Oral Q4H PRN Haus, Leeanne R, PA   10 mg at 06/15/22 0648   potassium chloride (KLOR-CON M) CR tablet 10 mEq  10 mEq Oral Daily Elizabeth Sauer R, PA   10 mEq at 06/15/22 4709   senna-docusate (Senokot-S) tablet 1 tablet  1 tablet Oral QHS PRN Haus, Leeanne R, PA       sodium chloride (OCEAN) 0.65 % nasal spray 1 spray  1 spray Each Nare PRN Irving Copas, PA-C       traMADol (ULTRAM) tablet 50 mg  50 mg Oral Q6H Haus, Leeanne R, PA   50 mg at 06/15/22 1157     Discharge Medications: Please see discharge summary for a list of discharge medications.  Relevant Imaging Results:  Relevant Lab Results:   Additional Information SSN [id number]  Henrietta Dine, RN

## 2022-06-15 NOTE — Progress Notes (Signed)
Physical Therapy Treatment Patient Details Name: Alisha Murphy MRN: [id number] DOB: 1935-03-27 Today's Date: 06/15/2022   History of Present Illness 87 y.o. female admitted for R TKA 06/14/22. PMH: skin cancer, CVA, acute renal failure, lumbar compression fx.    PT Comments    Pt tolerated increased ambulation distance of 74' with RW, no loss of balance, 6/10 pain, SaO2 91% on room air. Initiated TKA HEP. Noted plan is to DC to ST-SNF.    Recommendations for follow up therapy are one component of a multi-disciplinary discharge planning process, led by the attending physician.  Recommendations may be updated based on patient status, additional functional criteria and insurance authorization.  Follow Up Recommendations  Skilled nursing-short term rehab (<3 hours/day) Can patient physically be transported by private vehicle: Yes   Assistance Recommended at Discharge Frequent or constant Supervision/Assistance  Patient can return home with the following A little help with walking and/or transfers;A lot of help with bathing/dressing/bathroom;Assistance with cooking/housework;Direct supervision/assist for medications management;Help with stairs or ramp for entrance;Assist for transportation   Equipment Recommendations  None recommended by PT    Recommendations for Other Services       Precautions / Restrictions Precautions Precautions: Fall;Knee Precaution Booklet Issued: Yes (comment) Precaution Comments: 2 falls in past 6 months, reviewed no pillow under knee Restrictions Weight Bearing Restrictions: No RLE Weight Bearing: Weight bearing as tolerated Other Position/Activity Restrictions: WBAT     Mobility  Bed Mobility Overal bed mobility: Needs Assistance Bed Mobility: Supine to Sit     Supine to sit: Min assist, HOB elevated     General bed mobility comments: min A to raise trunk, used rail    Transfers Overall transfer level: Needs assistance Equipment used:  Rolling walker (2 wheels) Transfers: Sit to/from Stand Sit to Stand: Min assist, From elevated surface           General transfer comment: VCs hand placement, min A to power up    Ambulation/Gait Ambulation/Gait assistance: Min guard Gait Distance (Feet): 65 Feet Assistive device: Rolling walker (2 wheels) Gait Pattern/deviations: Step-to pattern Gait velocity: decr     General Gait Details: VCs sequencing, no loss of balance, no buckling, SaO2 91% on room air walking   Stairs             Wheelchair Mobility    Modified Rankin (Stroke Patients Only)       Balance Overall balance assessment: Needs assistance, History of Falls   Sitting balance-Leahy Scale: Good     Standing balance support: Bilateral upper extremity supported Standing balance-Leahy Scale: Poor                              Cognition Arousal/Alertness: Awake/alert Behavior During Therapy: WFL for tasks assessed/performed Overall Cognitive Status: Within Functional Limits for tasks assessed                                          Exercises Total Joint Exercises Ankle Circles/Pumps: AROM, Both, 10 reps, Supine Quad Sets: AROM, Right, 5 reps, Supine Heel Slides: AAROM, Right, 10 reps, Supine Hip ABduction/ADduction: AAROM, Right, 5 reps, Supine Long Arc Quad: AROM, Right, 10 reps, Seated Knee Flexion: AAROM, Right, 10 reps, Seated Goniometric ROM: 5-60* AAROM R knee    General Comments        Pertinent Vitals/Pain Pain Assessment  Pain Score: 6  Pain Location: R knee Pain Descriptors / Indicators: Operative site guarding Pain Intervention(s): Limited activity within patient's tolerance, Monitored during session, Premedicated before session, Ice applied    Home Living                          Prior Function            PT Goals (current goals can now be found in the care plan section) Acute Rehab PT Goals Patient Stated Goal: ST-SNF PT  Goal Formulation: With patient/family Time For Goal Achievement: 06/21/22 Potential to Achieve Goals: Good Progress towards PT goals: Progressing toward goals    Frequency    7X/week      PT Plan Current plan remains appropriate    Co-evaluation              AM-PAC PT "6 Clicks" Mobility   Outcome Measure  Help needed turning from your back to your side while in a flat bed without using bedrails?: A Little Help needed moving from lying on your back to sitting on the side of a flat bed without using bedrails?: A Little Help needed moving to and from a bed to a chair (including a wheelchair)?: A Little Help needed standing up from a chair using your arms (e.g., wheelchair or bedside chair)?: A Little Help needed to walk in hospital room?: A Little Help needed climbing 3-5 steps with a railing? : A Lot 6 Click Score: 17    End of Session Equipment Utilized During Treatment: Gait belt Activity Tolerance: Patient tolerated treatment well Patient left: in chair;with call bell/phone within reach;with chair alarm set Nurse Communication: Mobility status PT Visit Diagnosis: Difficulty in walking, not elsewhere classified (R26.2);Pain Pain - Right/Left: Right Pain - part of body: Knee     Time: 7494-4967 PT Time Calculation (min) (ACUTE ONLY): 23 min  Charges:  $Gait Training: 8-22 mins $Therapeutic Exercise: 8-22 mins                     Blondell Reveal Kistler PT 06/15/2022  Acute Rehabilitation Services  Office [phone]

## 2022-06-16 MED ORDER — TRAMADOL HCL 50 MG PO TABS: 50.0000 mg | ORAL_TABLET | Freq: Four times a day (QID) | ORAL | 0 refills | Status: AC | PRN

## 2022-06-16 MED ORDER — OXYCODONE HCL 5 MG PO TABS: 5.0000 mg | ORAL_TABLET | ORAL | 0 refills | Status: AC | PRN

## 2022-06-16 NOTE — Progress Notes (Signed)
Subjective: 2 Days Post-Op Procedure(s) (LRB): TOTAL KNEE ARTHROPLASTY (Right) Patient reports pain as mild.  Mostly in her right knee Doing well, was able to get up with PT Denies CP/SOB, N/T, N/V Has been able to urinate  Objective: Vital signs in last 24 hours: Temp:  [97.5 F (36.4 C)-98.9 F (37.2 C)] 97.7 F (36.5 C) (11/24 0619) Pulse Rate:  [51-55] 54 (11/24 0619) Resp:  [16-18] 18 (11/24 0619) BP: (136-150)/(52-59) 150/52 (11/24 0619) SpO2:  [90 %-97 %] 90 % (11/24 0619)  Intake/Output from previous day: 11/23 0701 - 11/24 0700 In: 530 [P.O.:530] Out: 400 [Urine:400] Intake/Output this shift: No intake/output data recorded.  No results for input(s): "HGB" in the last 72 hours. No results for input(s): "WBC", "RBC", "HCT", "PLT" in the last 72 hours. No results for input(s): "NA", "K", "CL", "CO2", "BUN", "CREATININE", "GLUCOSE", "CALCIUM" in the last 72 hours. No results for input(s): "LABPT", "INR" in the last 72 hours.  Neurologically intact Neurovascular intact Sensation intact distally Intact pulses distally Dorsiflexion/Plantar flexion intact Incision: dressing C/D/I No cellulitis present Compartment soft   Assessment/Plan: 2 Days Post-Op Procedure(s) (LRB): TOTAL KNEE ARTHROPLASTY (Right) Up with therapy continue working with PT, is scheduled to go to [facility] Rx placed in chart Once cleared by PT okay to d/c      Nettie Elm [facility] [phone] 06/16/2022, 8:16 AM

## 2022-06-16 NOTE — Plan of Care (Signed)
  Problem: Nutrition: Goal: Adequate nutrition will be maintained Outcome: Progressing   Problem: Coping: Goal: Level of anxiety will decrease Outcome: Progressing   Problem: Pain Managment: Goal: General experience of comfort will improve Outcome: Progressing   

## 2022-06-16 NOTE — Progress Notes (Signed)
The patient is currently confused and argumentative about what date her surgery took place.

## 2022-06-16 NOTE — Discharge Summary (Cosign Needed Addendum)
Physician Discharge Summary  Patient ID: Alisha Murphy MRN: [id number] DOB/AGE: 1935-02-18 87 y.o.  Admit date: 06/14/2022 Discharge date: 06/19/2022  Admission Diagnoses: Right knee osteoarthritis Discharge Diagnoses:  Principal Problem:   Osteoarthritis of right knee   Discharged Condition: good  Hospital Course: Patient was admitted on November 22 for a right total knee arthroplasty due to end-stage right knee osteoarthritis.  Patient tolerated surgery very well.  She was sent to PACU in postop floor in stable condition.  No events overnight.  Postop day 1 patient did well was progressing slowly with physical therapy.  She was able to get up with PT and ambulate in the hallway.  No events overnight.  Postop day 2 patient doing very well.  Patient is can to be going to [facility], unfortunately they do not admit over the weekend so she will be here until Monday.  Postop day 3 and 4 patient doing very well, was able to get up with physical therapy both days.  Pain is being very well-managed.  She is having no issues at this time.  Stop date 5 patient doing great this a.m.  She got up with physical therapy this morning and was discharged to to nursing facility.  At home with pain medication, nausea medication, muscle relaxer and aspirin 81 mg for DVT prevention  She will follow-up in the office in 2 weeks.  Leave Aquacel in place until follow-up appointment  Consults: None  Significant Diagnostic Studies: None  Treatments: IV hydration, antibiotics: Ancef, anticoagulation: ASA, therapies: PT, and surgery: Right  TKA  Discharge Exam: Blood pressure (!) 150/52, pulse (!) 54, temperature 97.7 F (36.5 C), resp. rate 18, height '5\' 2"'$  (1.575 m), weight 64.4 kg, SpO2 90 %. General appearance: alert, cooperative, appears stated age, and no distress Extremities: extremities normal, atraumatic, no cyanosis or edema Pulses: 2+ and symmetric Skin: Skin color, texture, turgor normal. No  rashes or lesions Incision/Wound: Dressings clean dry and intact  Disposition:    Allergies as of 06/19/2022       Reactions   Doxycycline Rash        Medication List     TAKE these medications    acetaminophen 500 MG tablet Commonly known as: TYLENOL Take 500-1,000 mg by mouth 3 (three) times daily as needed (pain.).   albuterol 108 (90 Base) MCG/ACT inhaler Commonly known as: VENTOLIN HFA Inhale 1-2 puffs into the lungs every 6 (six) hours as needed for wheezing or shortness of breath.   amLODipine 5 MG tablet Commonly known as: NORVASC Take 5 mg by mouth in the morning.   aspirin EC 81 MG tablet Take 1 tablet (81 mg total) by mouth in the morning and at bedtime. What changed: when to take this   cholecalciferol 25 MCG (1000 UNIT) tablet Commonly known as: VITAMIN D3 Take 1,000 Units by mouth in the morning.   gabapentin 300 MG capsule Commonly known as: NEURONTIN Take 300 mg by mouth 3 (three) times daily.   methocarbamol 500 MG tablet Commonly known as: ROBAXIN Take 1 tablet (500 mg total) by mouth 4 (four) times daily.   metoprolol succinate 100 MG 24 hr tablet Commonly known as: TOPROL-XL Take 100 mg by mouth in the morning.   ondansetron 4 MG tablet Commonly known as: Zofran Take 1 tablet (4 mg total) by mouth daily as needed for nausea or vomiting.   oxyCODONE 5 MG immediate release tablet Commonly known as: Roxicodone Take 1-2 tablets (5-10 mg total) by mouth  every 4 (four) hours as needed for severe pain.   potassium chloride 10 MEQ tablet Commonly known as: KLOR-CON M Take 10 mEq by mouth in the morning.   potassium chloride 10 MEQ tablet Commonly known as: KLOR-CON Take 10 mEq by mouth daily.   senna-docusate 8.6-50 MG tablet Commonly known as: Senokot-S Take 2 tablets by mouth in the morning.   traMADol 50 MG tablet Commonly known as: ULTRAM Take 1 tablet (50 mg total) by mouth every 12 (twelve) hours as needed. What changed:  reasons to take this   traMADol 50 MG tablet Commonly known as: Ultram Take 1 tablet (50 mg total) by mouth every 6 (six) hours as needed for severe pain. What changed: You were already taking a medication with the same name, and this prescription was added. Make sure you understand how and when to take each.   Trelegy Ellipta 100-62.5-25 MCG/ACT Aepb Generic drug: Fluticasone-Umeclidin-Vilant Inhale 1 puff into the lungs daily as needed.   VITAMIN C PO Take 1 tablet by mouth in the morning.               Discharge Care Instructions  (From admission, onward)           Start     Ordered   06/19/22 0000  Weight bearing as tolerated        06/19/22 0735   06/16/22 0000  Weight bearing as tolerated        06/16/22 0826             Signed: Derrick Ravel [phone] 06/19/2022, 7:36 AM

## 2022-06-16 NOTE — TOC Progression Note (Signed)
Transition of Care [facility]) - Progression Note    Patient Details  Name: Alisha Murphy MRN: [id number] Date of Birth: 06-Jan-1935  [facility] CM/SW Contact  Lennart Pall, LCSW Phone Number: 06/16/2022, 2:35 PM  Clinical Narrative:    Have spoken with [facility] (pt and family preferred facility) and they anticipate being able to offer bed, however, cannot admit until Monday.     Expected Discharge Plan: [place] Barriers to Discharge: Continued Medical Work up  Expected Discharge Plan and Services Expected Discharge Plan: [place]   Discharge Planning Services: CM Consult Post Acute Care Choice: [place] arrangements for the past 2 months: Single Family Home Expected Discharge Date: 06/17/22                                     Social Determinants of Health (SDOH) Interventions    Readmission Risk Interventions     No data to display

## 2022-06-16 NOTE — Progress Notes (Signed)
Physical Therapy Treatment Patient Details Name: Alisha Murphy MRN: [id number] DOB: 01-07-1935 Today's Date: 06/16/2022   History of Present Illness 87 y.o. female admitted for R TKA 06/14/22. PMH: skin cancer, CVA, acute renal failure, lumbar compression fx.    PT Comments    Pt ambulated 16' with RW, no loss of balance, no buckling of R knee. Pt performed TKA HEP with VCs and min A. She is making good progress and is ready to DC to SNF from a PT standpoint.    Recommendations for follow up therapy are one component of a multi-disciplinary discharge planning process, led by the attending physician.  Recommendations may be updated based on patient status, additional functional criteria and insurance authorization.  Follow Up Recommendations  Skilled nursing-short term rehab (<3 hours/day) Can patient physically be transported by private vehicle: Yes   Assistance Recommended at Discharge Frequent or constant Supervision/Assistance  Patient can return home with the following A little help with walking and/or transfers;A lot of help with bathing/dressing/bathroom;Assistance with cooking/housework;Direct supervision/assist for medications management;Help with stairs or ramp for entrance;Assist for transportation   Equipment Recommendations  None recommended by PT    Recommendations for Other Services       Precautions / Restrictions Precautions Precautions: Fall;Knee Precaution Booklet Issued: Yes (comment) Precaution Comments: 2 falls in past 6 months, reviewed no pillow under knee Restrictions Weight Bearing Restrictions: No RLE Weight Bearing: Weight bearing as tolerated Other Position/Activity Restrictions: WBAT     Mobility  Bed Mobility Overal bed mobility: Needs Assistance Bed Mobility: Supine to Sit     Supine to sit: HOB elevated, Supervision     General bed mobility comments: increased time, used rail    Transfers Overall transfer level: Needs  assistance Equipment used: Rolling walker (2 wheels) Transfers: Sit to/from Stand Sit to Stand: From elevated surface, Min guard           General transfer comment: VCs hand placement, min A to power up    Ambulation/Gait Ambulation/Gait assistance: Min guard Gait Distance (Feet): 85 Feet Assistive device: Rolling walker (2 wheels) Gait Pattern/deviations: Step-to pattern Gait velocity: decr     General Gait Details: VCs sequencing, no loss of balance, no buckling   Stairs             Wheelchair Mobility    Modified Rankin (Stroke Patients Only)       Balance Overall balance assessment: Needs assistance, History of Falls   Sitting balance-Leahy Scale: Good     Standing balance support: Bilateral upper extremity supported Standing balance-Leahy Scale: Poor                              Cognition Arousal/Alertness: Awake/alert Behavior During Therapy: WFL for tasks assessed/performed Overall Cognitive Status: Within Functional Limits for tasks assessed                                          Exercises Total Joint Exercises Ankle Circles/Pumps: AROM, Both, 10 reps, Supine Quad Sets: AROM, Right, 5 reps, Supine Heel Slides: AAROM, Right, 10 reps, Supine Hip ABduction/ADduction: AAROM, Right, 5 reps, Supine Knee Flexion: AAROM, Right, 10 reps, Seated Goniometric ROM: 5-65* AAROM R knee    General Comments        Pertinent Vitals/Pain Pain Assessment Pain Score: 2  Pain Location: R knee Pain  Descriptors / Indicators: Operative site guarding Pain Intervention(s): Limited activity within patient's tolerance, Monitored during session, Premedicated before session, Patient requesting pain meds-RN notified, RN gave pain meds during session    Home Living                          Prior Function            PT Goals (current goals can now be found in the care plan section) Acute Rehab PT Goals Patient Stated  Goal: ST-SNF PT Goal Formulation: With patient/family Time For Goal Achievement: 06/21/22 Potential to Achieve Goals: Good Progress towards PT goals: Progressing toward goals    Frequency    7X/week      PT Plan Current plan remains appropriate    Co-evaluation              AM-PAC PT "6 Clicks" Mobility   Outcome Measure  Help needed turning from your back to your side while in a flat bed without using bedrails?: A Little Help needed moving from lying on your back to sitting on the side of a flat bed without using bedrails?: A Little Help needed moving to and from a bed to a chair (including a wheelchair)?: A Little Help needed standing up from a chair using your arms (e.g., wheelchair or bedside chair)?: A Little Help needed to walk in hospital room?: A Little Help needed climbing 3-5 steps with a railing? : A Lot 6 Click Score: 17    End of Session Equipment Utilized During Treatment: Gait belt Activity Tolerance: Patient tolerated treatment well Patient left: in chair;with call bell/phone within reach;with chair alarm set Nurse Communication: Mobility status PT Visit Diagnosis: Difficulty in walking, not elsewhere classified (R26.2);Pain Pain - Right/Left: Right Pain - part of body: Knee     Time: 0935-1007 PT Time Calculation (min) (ACUTE ONLY): 32 min  Charges:  $Gait Training: 8-22 mins $Therapeutic Exercise: 8-22 mins                     Blondell Reveal Kistler PT 06/16/2022  Acute Rehabilitation Services  Office [phone]

## 2022-06-17 NOTE — Progress Notes (Signed)
Subjective: 3 Days Post-Op Procedure(s) (LRB): TOTAL KNEE ARTHROPLASTY (Right) Patient reports pain as 0 on 0-10 scale.   Was able to get up with physical therapy yesterday Unfortunately the rehab facility that is ongoing to does not accept admission over the weekend so has to wait till Monday   Objective: Vital signs in last 24 hours: Temp:  [97.6 F (36.4 C)-97.7 F (36.5 C)] 97.7 F (36.5 C) (11/25 0616) Pulse Rate:  [55-58] 58 (11/25 0616) Resp:  [17-18] 18 (11/25 0616) BP: (141-162)/(64-65) 162/65 (11/25 0616) SpO2:  [89 %-93 %] 89 % (11/25 0616)  Intake/Output from previous day: 11/24 0701 - 11/25 0700 In: 240 [P.O.:240] Out: -  Intake/Output this shift: Total I/O In: 120 [P.O.:120] Out: 400 [Urine:400]  No results for input(s): "HGB" in the last 72 hours. No results for input(s): "WBC", "RBC", "HCT", "PLT" in the last 72 hours. No results for input(s): "NA", "K", "CL", "CO2", "BUN", "CREATININE", "GLUCOSE", "CALCIUM" in the last 72 hours. No results for input(s): "LABPT", "INR" in the last 72 hours.  Neurologically intact Neurovascular intact Sensation intact distally Intact pulses distally Dorsiflexion/Plantar flexion intact Incision: dressing C/D/I No cellulitis present Compartment soft   Assessment/Plan: 3 Days Post-Op Procedure(s) (LRB): TOTAL KNEE ARTHROPLASTY (Right) Up with therapy will continue to work with physical therapy while she is here Is going to be here until Monday when she can get admitted to [facility] rehab facility   Drue Novel, PA-C [facility] with Dr. Theda Sers [phone] 06/17/2022, 8:43 AM

## 2022-06-17 NOTE — Progress Notes (Signed)
Physical Therapy Treatment Patient Details Name: Alisha Murphy MRN: [id number] DOB: 03-15-1935 Today's Date: 06/17/2022   History of Present Illness 87 y.o. female admitted for R TKA 06/14/22. PMH: skin cancer, CVA, acute renal failure, lumbar compression fx.    PT Comments    Pt making gradual progress.  Min cues for safety with transfer techniques and gait.  Has increased pain with exercises and walking but eases at rest.  Good quad activation.  Noted plan is for SNF on Monday.   Recommendations for follow up therapy are one component of a multi-disciplinary discharge planning process, led by the attending physician.  Recommendations may be updated based on patient status, additional functional criteria and insurance authorization.  Follow Up Recommendations  Follow physician's recommendations for discharge plan and follow up therapies (plan is for SNF)     Assistance Recommended at Discharge Frequent or constant Supervision/Assistance  Patient can return home with the following A little help with walking and/or transfers;Assistance with cooking/housework;Direct supervision/assist for medications management;Help with stairs or ramp for entrance;Assist for transportation;A little help with bathing/dressing/bathroom   Equipment Recommendations  None recommended by PT    Recommendations for Other Services       Precautions / Restrictions Precautions Precautions: Fall;Knee Precaution Booklet Issued: Yes (comment) Precaution Comments: 2 falls in past 6 months, reviewed no pillow under knee Restrictions RLE Weight Bearing: Weight bearing as tolerated     Mobility  Bed Mobility               General bed mobility comments: in chair    Transfers Overall transfer level: Needs assistance Equipment used: Rolling walker (2 wheels) Transfers: Sit to/from Stand Sit to Stand: From elevated surface, Min guard           General transfer comment: VC for hand placement and R  LE management    Ambulation/Gait Ambulation/Gait assistance: Min guard Gait Distance (Feet): 85 Feet Assistive device: Rolling walker (2 wheels) Gait Pattern/deviations: Step-to pattern       General Gait Details: VCs and demo for RW proximity; occasional step through pattern   Stairs             Wheelchair Mobility    Modified Rankin (Stroke Patients Only)       Balance Overall balance assessment: Needs assistance, History of Falls   Sitting balance-Leahy Scale: Good     Standing balance support: Bilateral upper extremity supported, Reliant on assistive device for balance Standing balance-Leahy Scale: Poor                              Cognition Arousal/Alertness: Awake/alert Behavior During Therapy: WFL for tasks assessed/performed Overall Cognitive Status: Within Functional Limits for tasks assessed                                          Exercises Total Joint Exercises Ankle Circles/Pumps: AROM, Both, 10 reps, Supine Quad Sets: AROM, Right, Supine, 10 reps Heel Slides: AAROM, Right, 10 reps, Supine Hip ABduction/ADduction: AAROM, Right, Supine, 10 reps Long Arc Quad: AROM, Right, 10 reps, Seated Knee Flexion: AAROM, Right, 10 reps, Seated Goniometric ROM: 5 to 65 degrees ROM R knee    General Comments        Pertinent Vitals/Pain Pain Assessment Pain Assessment: 0-10 Pain Score: 5  (5/10 walking; 0/10 rest) Pain Location:  R knee Pain Descriptors / Indicators: Operative site guarding Pain Intervention(s): Limited activity within patient's tolerance, Monitored during session, Premedicated before session    Home Living                          Prior Function            PT Goals (current goals can now be found in the care plan section) Progress towards PT goals: Progressing toward goals    Frequency    7X/week      PT Plan Current plan remains appropriate    Co-evaluation               AM-PAC PT "6 Clicks" Mobility   Outcome Measure  Help needed turning from your back to your side while in a flat bed without using bedrails?: A Little Help needed moving from lying on your back to sitting on the side of a flat bed without using bedrails?: A Little Help needed moving to and from a bed to a chair (including a wheelchair)?: A Little Help needed standing up from a chair using your arms (e.g., wheelchair or bedside chair)?: A Little Help needed to walk in hospital room?: A Little Help needed climbing 3-5 steps with a railing? : A Little 6 Click Score: 18    End of Session Equipment Utilized During Treatment: Gait belt Activity Tolerance: Patient tolerated treatment well Patient left: in chair;with call bell/phone within reach;with chair alarm set;with family/visitor present Nurse Communication: Mobility status PT Visit Diagnosis: Difficulty in walking, not elsewhere classified (R26.2);Pain Pain - Right/Left: Right Pain - part of body: Shoulder     Time: 0940-1003 PT Time Calculation (min) (ACUTE ONLY): 23 min  Charges:  $Gait Training: 8-22 mins $Therapeutic Exercise: 8-22 mins                     Abran Richard, PT Acute Rehab [facility] [place] 06/17/2022, 10:23 AM

## 2022-06-18 NOTE — Progress Notes (Signed)
   Subjective: 4 Days Post-Op Procedure(s) (LRB): TOTAL KNEE ARTHROPLASTY (Right)  Pt doing well Plan for SNF placement tomorrow Denies any new symptoms or issues Patient reports pain as mild.  Objective:   VITALS:   Vitals:   06/17/22 2044 06/18/22 0414  BP: (!) 165/60 (!) 158/62  Pulse: 64 62  Resp: 20 18  Temp: 97.7 F (36.5 C) 98.4 F (36.9 C)  SpO2: 95% 91%    Right knee incision healing well Nv intact distally No rashes or edema distally  Good rom without guarding  LABS No results for input(s): "HGB", "HCT", "WBC", "PLT" in the last 72 hours.  No results for input(s): "NA", "K", "BUN", "CREATININE", "GLUCOSE" in the last 72 hours.   Assessment/Plan: 4 Days Post-Op Procedure(s) (LRB): TOTAL KNEE ARTHROPLASTY (Right) Continue PT/OT Plan for d/c to SNF tomorrow Pain management as needed     Kathrynn Speed, [place] [facility] now [employer] [facility] [address] [place], [place], [place] [address] Phone: [phone] [email or web address] Facebook  [employer]

## 2022-06-18 NOTE — Progress Notes (Signed)
Physical Therapy Treatment Patient Details Name: Alisha Murphy MRN: [id number] DOB: Apr 06, 1935 Today's Date: 06/18/2022   History of Present Illness 87 y.o. female admitted for R TKA 06/14/22. PMH: skin cancer, CVA, acute renal failure, lumbar compression fx.    PT Comments    Pt is limited by pain this session. Able to review exercises, good quad activation however amb ltd by pain.   Recommendations for follow up therapy are one component of a multi-disciplinary discharge planning process, led by the attending physician.  Recommendations may be updated based on patient status, additional functional criteria and insurance authorization.  Follow Up Recommendations  Follow physician's recommendations for discharge plan and follow up therapies Can patient physically be transported by private vehicle: Yes   Assistance Recommended at Discharge Frequent or constant Supervision/Assistance  Patient can return home with the following A little help with walking and/or transfers;Assistance with cooking/housework;Direct supervision/assist for medications management;Help with stairs or ramp for entrance;Assist for transportation;A little help with bathing/dressing/bathroom   Equipment Recommendations  None recommended by PT    Recommendations for Other Services       Precautions / Restrictions Precautions Precautions: Fall;Knee Precaution Comments: 2 falls in past 6 months, reviewed no pillow under knee Restrictions Weight Bearing Restrictions: No RLE Weight Bearing: Weight bearing as tolerated Other Position/Activity Restrictions: WBAT     Mobility  Bed Mobility Overal bed mobility: Needs Assistance Bed Mobility: Supine to Sit, Sit to Supine     Supine to sit: Supervision, HOB elevated Sit to supine: Min assist   General bed mobility comments: assist to lift RLE into bed, attempting to self assist RLE with LLE however limited by pain    Transfers Overall transfer level: Needs  assistance Equipment used: Rolling walker (2 wheels) Transfers: Sit to/from Stand Sit to Stand: Min assist, From elevated surface           General transfer comment: VC for hand placement and R LE management    Ambulation/Gait               General Gait Details:  (lateral steps along EOB only, ltd by pain)   Stairs             Wheelchair Mobility    Modified Rankin (Stroke Patients Only)       Balance     Sitting balance-Leahy Scale: Good     Standing balance support: Bilateral upper extremity supported, Reliant on assistive device for balance Standing balance-Leahy Scale: Poor                              Cognition Arousal/Alertness: Awake/alert Behavior During Therapy: WFL for tasks assessed/performed Overall Cognitive Status: Within Functional Limits for tasks assessed                                          Exercises Total Joint Exercises Ankle Circles/Pumps: AROM, Both, 10 reps, Supine Heel Slides: AAROM, Right, Supine, 5 reps, Limitations Heel Slides Limitations: pain Hip ABduction/ADduction: AAROM, Right, Supine, 10 reps Straight Leg Raises: AROM, Right, 10 reps    General Comments        Pertinent Vitals/Pain Pain Assessment Pain Assessment: Faces Faces Pain Scale: Hurts worst Pain Location: R knee Pain Descriptors / Indicators: Operative site guarding Pain Intervention(s): Limited activity within patient's tolerance, Monitored during session, Repositioned, Premedicated before  session, Ice applied    Home Living                          Prior Function            PT Goals (current goals can now be found in the care plan section) Acute Rehab PT Goals Patient Stated Goal: ST-SNF PT Goal Formulation: With patient/family Time For Goal Achievement: 06/21/22 Potential to Achieve Goals: Good Progress towards PT goals: Progressing toward goals    Frequency    7X/week      PT Plan  Current plan remains appropriate    Co-evaluation              AM-PAC PT "6 Clicks" Mobility   Outcome Measure  Help needed turning from your back to your side while in a flat bed without using bedrails?: A Little Help needed moving from lying on your back to sitting on the side of a flat bed without using bedrails?: A Little Help needed moving to and from a bed to a chair (including a wheelchair)?: A Little Help needed standing up from a chair using your arms (e.g., wheelchair or bedside chair)?: A Little Help needed to walk in hospital room?: A Lot Help needed climbing 3-5 steps with a railing? : Total 6 Click Score: 15    End of Session Equipment Utilized During Treatment: Gait belt Activity Tolerance: Patient limited by pain Patient left: in bed;with call bell/phone within reach;with bed alarm set;with family/visitor present Nurse Communication: Mobility status PT Visit Diagnosis: Difficulty in walking, not elsewhere classified (R26.2);Pain Pain - Right/Left: Right Pain - part of body: Knee     Time: 5621-3086 PT Time Calculation (min) (ACUTE ONLY): 17 min  Charges:                        Baxter Flattery, PT  Acute Rehab Dept [facility]) [phone]  [facility] Weekend Pager [facility] only)  [phone]  06/18/2022    [facility] 06/18/2022, 11:27 AM

## 2022-06-18 NOTE — Progress Notes (Signed)
Physical Therapy Treatment Patient Details Name: Alisha Murphy MRN: [id number] DOB: 15-Sep-1934 Today's Date: 06/18/2022   History of Present Illness 87 y.o. female admitted for R TKA 06/14/22. PMH: skin cancer, CVA, acute renal failure, lumbar compression fx.    PT Comments    Progressing this pm, incr gait tolerance with decr pain compared to am session.    Recommendations for follow up therapy are one component of a multi-disciplinary discharge planning process, led by the attending physician.  Recommendations may be updated based on patient status, additional functional criteria and insurance authorization.  Follow Up Recommendations  Follow physician's recommendations for discharge plan and follow up therapies Can patient physically be transported by private vehicle: Yes   Assistance Recommended at Discharge Frequent or constant Supervision/Assistance  Patient can return home with the following A little help with walking and/or transfers;Assistance with cooking/housework;Direct supervision/assist for medications management;Help with stairs or ramp for entrance;Assist for transportation;A little help with bathing/dressing/bathroom   Equipment Recommendations  None recommended by PT    Recommendations for Other Services       Precautions / Restrictions Precautions Precautions: Fall;Knee Precaution Comments: 2 falls in past 6 months, reviewed no pillow under knee Restrictions Other Position/Activity Restrictions: WBAT     Mobility  Bed Mobility Overal bed mobility: Needs Assistance Bed Mobility: Supine to Sit, Sit to Supine     Supine to sit: Supervision, HOB elevated Sit to supine: Min assist   General bed mobility comments: incr time, no physical assist    Transfers Overall transfer level: Needs assistance Equipment used: Rolling walker (2 wheels) Transfers: Sit to/from Stand Sit to Stand: Min assist, From elevated surface           General transfer  comment: VC for hand placement and R LE management    Ambulation/Gait Ambulation/Gait assistance: Min guard Gait Distance (Feet): 90 Feet Assistive device: Rolling walker (2 wheels) Gait Pattern/deviations: Step-to pattern, Narrow base of support       General Gait Details: VCs and demo for RW proximity; occasional step through pattern (lateral steps along EOB only, ltd by pain)   Stairs             Wheelchair Mobility    Modified Rankin (Stroke Patients Only)       Balance     Sitting balance-Leahy Scale: Good     Standing balance support: Bilateral upper extremity supported, Reliant on assistive device for balance Standing balance-Leahy Scale: Poor                              Cognition Arousal/Alertness: Awake/alert Behavior During Therapy: WFL for tasks assessed/performed Overall Cognitive Status: Within Functional Limits for tasks assessed                                          Exercises Total Joint Exercises Ankle Circles/Pumps: AROM, Both, 10 reps, Supine Heel Slides: AAROM, Right, Supine, 5 reps, Limitations Heel Slides Limitations: pain Hip ABduction/ADduction: AAROM, Right, Supine, 10 reps Straight Leg Raises: AROM, Right, 10 reps    General Comments        Pertinent Vitals/Pain Pain Assessment Pain Assessment: Faces Faces Pain Scale: Hurts little more Pain Location: R knee Pain Descriptors / Indicators: Operative site guarding Pain Intervention(s): Limited activity within patient's tolerance, Monitored during session    Home Living  Prior Function            PT Goals (current goals can now be found in the care plan section) Acute Rehab PT Goals Patient Stated Goal: ST-SNF PT Goal Formulation: With patient/family Time For Goal Achievement: 06/21/22 Potential to Achieve Goals: Good Progress towards PT goals: Progressing toward goals    Frequency     7X/week      PT Plan Current plan remains appropriate    Co-evaluation              AM-PAC PT "6 Clicks" Mobility   Outcome Measure  Help needed turning from your back to your side while in a flat bed without using bedrails?: A Little Help needed moving from lying on your back to sitting on the side of a flat bed without using bedrails?: A Little Help needed moving to and from a bed to a chair (including a wheelchair)?: A Little Help needed standing up from a chair using your arms (e.g., wheelchair or bedside chair)?: A Little Help needed to walk in hospital room?: A Lot Help needed climbing 3-5 steps with a railing? : Total 6 Click Score: 15    End of Session Equipment Utilized During Treatment: Gait belt Activity Tolerance: Patient limited by pain Patient left: with call bell/phone within reach;with family/visitor present;in chair;with chair alarm set Nurse Communication: Mobility status PT Visit Diagnosis: Difficulty in walking, not elsewhere classified (R26.2);Pain Pain - Right/Left: Right Pain - part of body: Knee     Time: 1540-1555 PT Time Calculation (min) (ACUTE ONLY): 15 min  Charges:  $Gait Training: 8-22 mins                     Baxter Flattery, PT  Acute Rehab Dept [facility]) [phone]  [facility] Weekend Pager [facility] only)  [phone]  06/18/2022    [facility] 06/18/2022, 3:56 PM

## 2022-06-19 ENCOUNTER — Encounter (HOSPITAL_COMMUNITY): Payer: Self-pay | Admitting: Specialist

## 2022-06-19 DIAGNOSIS — R531 Weakness: Secondary | ICD-10-CM | POA: Diagnosis not present

## 2022-06-19 DIAGNOSIS — G629 Polyneuropathy, unspecified: Secondary | ICD-10-CM | POA: Diagnosis not present

## 2022-06-19 DIAGNOSIS — Z8673 Personal history of transient ischemic attack (TIA), and cerebral infarction without residual deficits: Secondary | ICD-10-CM | POA: Diagnosis not present

## 2022-06-19 DIAGNOSIS — R54 Age-related physical debility: Secondary | ICD-10-CM | POA: Diagnosis not present

## 2022-06-19 DIAGNOSIS — M6281 Muscle weakness (generalized): Secondary | ICD-10-CM | POA: Diagnosis not present

## 2022-06-19 DIAGNOSIS — J449 Chronic obstructive pulmonary disease, unspecified: Secondary | ICD-10-CM | POA: Diagnosis not present

## 2022-06-19 DIAGNOSIS — K219 Gastro-esophageal reflux disease without esophagitis: Secondary | ICD-10-CM | POA: Diagnosis not present

## 2022-06-19 DIAGNOSIS — I10 Essential (primary) hypertension: Secondary | ICD-10-CM | POA: Diagnosis not present

## 2022-06-19 DIAGNOSIS — I4891 Unspecified atrial fibrillation: Secondary | ICD-10-CM | POA: Diagnosis not present

## 2022-06-19 DIAGNOSIS — Z96651 Presence of right artificial knee joint: Secondary | ICD-10-CM | POA: Diagnosis not present

## 2022-06-19 DIAGNOSIS — R41841 Cognitive communication deficit: Secondary | ICD-10-CM | POA: Diagnosis not present

## 2022-06-19 DIAGNOSIS — I2510 Atherosclerotic heart disease of native coronary artery without angina pectoris: Secondary | ICD-10-CM | POA: Diagnosis not present

## 2022-06-19 DIAGNOSIS — R569 Unspecified convulsions: Secondary | ICD-10-CM | POA: Diagnosis not present

## 2022-06-19 DIAGNOSIS — J45998 Other asthma: Secondary | ICD-10-CM | POA: Diagnosis not present

## 2022-06-19 DIAGNOSIS — Z4789 Encounter for other orthopedic aftercare: Secondary | ICD-10-CM | POA: Diagnosis not present

## 2022-06-19 DIAGNOSIS — Z471 Aftercare following joint replacement surgery: Secondary | ICD-10-CM | POA: Diagnosis not present

## 2022-06-19 DIAGNOSIS — R2689 Other abnormalities of gait and mobility: Secondary | ICD-10-CM | POA: Diagnosis not present

## 2022-06-19 NOTE — Plan of Care (Signed)
  Problem: Pain Managment: Goal: General experience of comfort will improve Outcome: Progressing   Problem: Coping: Goal: Level of anxiety will decrease Outcome: Progressing   

## 2022-06-19 NOTE — Progress Notes (Signed)
Subjective: 5 Days Post-Op Procedure(s) (LRB): TOTAL KNEE ARTHROPLASTY (Right) Patient reports pain as mild.   Patient was coming back from the restroom when seen this morning She was ambulating with walker very well Denies any chest pain, shortness of breath, numbness or tingling   Objective: Vital signs in last 24 hours: Temp:  [97.7 F (36.5 C)-98.6 F (37 C)] 98.6 F (37 C) (11/27 0548) Pulse Rate:  [64-70] 68 (11/27 0548) Resp:  [16-18] 17 (11/27 0548) BP: (120-135)/(53-82) 120/68 (11/27 0548) SpO2:  [90 %-94 %] 90 % (11/27 0548)  Intake/Output from previous day: 11/26 0701 - 11/27 0700 In: 720 [P.O.:720] Out: -  Intake/Output this shift: No intake/output data recorded.  No results for input(s): "HGB" in the last 72 hours. No results for input(s): "WBC", "RBC", "HCT", "PLT" in the last 72 hours. No results for input(s): "NA", "K", "CL", "CO2", "BUN", "CREATININE", "GLUCOSE", "CALCIUM" in the last 72 hours. No results for input(s): "LABPT", "INR" in the last 72 hours.  Neurologically intact Neurovascular intact Sensation intact distally Intact pulses distally Dorsiflexion/Plantar flexion intact Incision: dressing C/D/I No cellulitis present Compartment soft   Assessment/Plan: 5 Days Post-Op Procedure(s) (LRB): TOTAL KNEE ARTHROPLASTY (Right) Up with therapy Discharge to SNF today, [facility] Prescriptions have already been placed in the chart DVT prophylaxis will be aspirin 81 mg twice daily Continue with compression stockings until postop appointment We will see her back in the office in 2 weeks   Drue Novel, PA-C [facility] with Dr. Theda Sers [phone] 06/19/2022, 7:32 AM

## 2022-06-19 NOTE — TOC Transition Note (Signed)
Transition of Care [facility]) - CM/SW Discharge Note   Patient Details  Name: Alisha Murphy MRN: [id number] Date of Birth: 29-May-1935  Transition of Care [facility]) CM/SW Contact:  Lennart Pall, LCSW Phone Number: 06/19/2022, 4:55 PM   Clinical Narrative:     Pt medically cleared for dc today to [facility] and have received insurance authorization.  Pt and son chose plan to transport via private vehicle.  RN has called report.  No further TOC needs.  Final next level of care: Skilled Nursing Facility Barriers to Discharge: Barriers Resolved   Patient Goals and CMS Choice        Discharge Placement PASRR number recieved: 06/15/22            Patient chooses bed at: Other - please specify in the comment section below: [facility]) Patient to be transferred to facility by: son to transport      Discharge Plan and Services   Discharge Planning Services: CM Consult Post Acute Care Choice: [place]                               Social Determinants of Health (SDOH) Interventions     Readmission Risk Interventions     No data to display

## 2022-06-19 NOTE — Progress Notes (Signed)
Physical Therapy Treatment Patient Details Name: Alisha Murphy MRN: [id number] DOB: April 20, 1935 Today's Date: 06/19/2022   History of Present Illness 87 y.o. female admitted for R TKA 06/14/22. PMH: skin cancer, CVA, acute renal failure, lumbar compression fx.    PT Comments    Reviewed TKA, HEP. Improving knee ROM and good quad activation   Recommendations for follow up therapy are one component of a multi-disciplinary discharge planning process, led by the attending physician.  Recommendations may be updated based on patient status, additional functional criteria and insurance authorization.  Follow Up Recommendations  Follow physician's recommendations for discharge plan and follow up therapies Can patient physically be transported by private vehicle: Yes   Assistance Recommended at Discharge Frequent or constant Supervision/Assistance  Patient can return home with the following A little help with walking and/or transfers;Assistance with cooking/housework;Direct supervision/assist for medications management;Help with stairs or ramp for entrance;Assist for transportation;A little help with bathing/dressing/bathroom   Equipment Recommendations  None recommended by PT    Recommendations for Other Services       Precautions / Restrictions Precautions Precautions: Fall;Knee Precaution Comments: 2 falls in past 6 months, reviewed no pillow under knee Restrictions Other Position/Activity Restrictions: WBAT     Mobility  Bed Mobility                    Transfers                        Ambulation/Gait               General Gait Details: declines amb/OOB   Stairs             Wheelchair Mobility    Modified Rankin (Stroke Patients Only)       Balance     Sitting balance-Leahy Scale: Good     Standing balance support: Bilateral upper extremity supported, Reliant on assistive device for balance Standing balance-Leahy Scale: Poor                               Cognition Arousal/Alertness: Awake/alert Behavior During Therapy: WFL for tasks assessed/performed Overall Cognitive Status: Within Functional Limits for tasks assessed                                          Exercises Total Joint Exercises Ankle Circles/Pumps: AROM, Both, 10 reps, Supine Quad Sets: AROM, Right, Supine, 10 reps Heel Slides: AAROM, Right, Supine, 10 reps Hip ABduction/ADduction: AAROM, Right, Supine, 10 reps Straight Leg Raises: AROM, Right, 10 reps Long Arc Quad: AROM, Right, 10 reps, Seated    General Comments        Pertinent Vitals/Pain Pain Assessment Pain Assessment: 0-10 Faces Pain Scale: Hurts whole lot Pain Location: R knee Pain Descriptors / Indicators: Operative site guarding Pain Intervention(s): Monitored during session, Limited activity within patient's tolerance, Premedicated before session, Repositioned, Ice applied    Home Living                          Prior Function            PT Goals (current goals can now be found in the care plan section) Acute Rehab PT Goals Patient Stated Goal: ST-SNF PT Goal Formulation: With patient/family Time For Goal  Achievement: 06/21/22 Potential to Achieve Goals: Good Progress towards PT goals: Progressing toward goals    Frequency    7X/week      PT Plan Current plan remains appropriate    Co-evaluation              AM-PAC PT "6 Clicks" Mobility   Outcome Measure  Help needed turning from your back to your side while in a flat bed without using bedrails?: A Little Help needed moving from lying on your back to sitting on the side of a flat bed without using bedrails?: A Little Help needed moving to and from a bed to a chair (including a wheelchair)?: A Little Help needed standing up from a chair using your arms (e.g., wheelchair or bedside chair)?: A Little Help needed to walk in hospital room?: A Lot Help needed  climbing 3-5 steps with a railing? : Total 6 Click Score: 15    End of Session Equipment Utilized During Treatment: Gait belt Activity Tolerance: Patient limited by pain Patient left: in bed;with call bell/phone within reach;with bed alarm set;with family/visitor present Nurse Communication: Mobility status PT Visit Diagnosis: Difficulty in walking, not elsewhere classified (R26.2);Pain Pain - Right/Left: Right Pain - part of body: Knee     Time: 1052-1110 PT Time Calculation (min) (ACUTE ONLY): 18 min  Charges:  $Therapeutic Exercise: 8-22 mins                     Baxter Flattery, PT  Acute Rehab Dept [facility]) [phone]  [facility] Weekend Pager [facility] only)  [phone]  06/19/2022    [facility] 06/19/2022, 11:17 AM

## 2022-06-19 NOTE — Plan of Care (Signed)
Patient discharging with son via private vehicle to SNF. Report called to [facility] at [place], RN 06/19/22 3:26 PM

## 2022-06-28 DIAGNOSIS — Z4789 Encounter for other orthopedic aftercare: Secondary | ICD-10-CM | POA: Diagnosis not present

## 2022-07-02 DIAGNOSIS — I2510 Atherosclerotic heart disease of native coronary artery without angina pectoris: Secondary | ICD-10-CM | POA: Diagnosis not present

## 2022-07-02 DIAGNOSIS — I10 Essential (primary) hypertension: Secondary | ICD-10-CM | POA: Diagnosis not present

## 2022-07-02 DIAGNOSIS — Z471 Aftercare following joint replacement surgery: Secondary | ICD-10-CM | POA: Diagnosis not present

## 2022-07-02 DIAGNOSIS — I4891 Unspecified atrial fibrillation: Secondary | ICD-10-CM | POA: Diagnosis not present

## 2022-07-06 DIAGNOSIS — I10 Essential (primary) hypertension: Secondary | ICD-10-CM | POA: Diagnosis not present

## 2022-07-06 DIAGNOSIS — Z96652 Presence of left artificial knee joint: Secondary | ICD-10-CM | POA: Diagnosis not present

## 2022-07-06 DIAGNOSIS — M150 Primary generalized (osteo)arthritis: Secondary | ICD-10-CM | POA: Diagnosis not present

## 2022-07-06 DIAGNOSIS — G629 Polyneuropathy, unspecified: Secondary | ICD-10-CM | POA: Diagnosis not present

## 2022-08-31 DIAGNOSIS — R296 Repeated falls: Secondary | ICD-10-CM | POA: Diagnosis not present

## 2022-08-31 DIAGNOSIS — M5416 Radiculopathy, lumbar region: Secondary | ICD-10-CM | POA: Diagnosis not present

## 2022-09-07 DIAGNOSIS — M5416 Radiculopathy, lumbar region: Secondary | ICD-10-CM | POA: Diagnosis not present

## 2022-09-20 DIAGNOSIS — Z4789 Encounter for other orthopedic aftercare: Secondary | ICD-10-CM | POA: Diagnosis not present

## 2022-10-05 DIAGNOSIS — Z0000 Encounter for general adult medical examination without abnormal findings: Secondary | ICD-10-CM | POA: Diagnosis not present

## 2022-10-05 DIAGNOSIS — Z96652 Presence of left artificial knee joint: Secondary | ICD-10-CM | POA: Diagnosis not present

## 2022-10-05 DIAGNOSIS — J4520 Mild intermittent asthma, uncomplicated: Secondary | ICD-10-CM | POA: Diagnosis not present

## 2022-10-05 DIAGNOSIS — R3981 Functional urinary incontinence: Secondary | ICD-10-CM | POA: Diagnosis not present

## 2022-10-05 DIAGNOSIS — G629 Polyneuropathy, unspecified: Secondary | ICD-10-CM | POA: Diagnosis not present

## 2022-10-05 DIAGNOSIS — I10 Essential (primary) hypertension: Secondary | ICD-10-CM | POA: Diagnosis not present

## 2022-10-05 DIAGNOSIS — M150 Primary generalized (osteo)arthritis: Secondary | ICD-10-CM | POA: Diagnosis not present

## 2022-11-16 DIAGNOSIS — H524 Presbyopia: Secondary | ICD-10-CM | POA: Diagnosis not present

## 2022-12-04 DIAGNOSIS — M47816 Spondylosis without myelopathy or radiculopathy, lumbar region: Secondary | ICD-10-CM | POA: Diagnosis not present

## 2022-12-12 ENCOUNTER — Ambulatory Visit: Payer: Medicare Other | Admitting: Urology

## 2022-12-16 DIAGNOSIS — J449 Chronic obstructive pulmonary disease, unspecified: Secondary | ICD-10-CM | POA: Diagnosis not present

## 2022-12-19 DIAGNOSIS — Z6825 Body mass index (BMI) 25.0-25.9, adult: Secondary | ICD-10-CM | POA: Diagnosis not present

## 2022-12-19 DIAGNOSIS — J4540 Moderate persistent asthma, uncomplicated: Secondary | ICD-10-CM | POA: Diagnosis not present

## 2022-12-19 DIAGNOSIS — I10 Essential (primary) hypertension: Secondary | ICD-10-CM | POA: Diagnosis not present

## 2023-01-02 DIAGNOSIS — J4540 Moderate persistent asthma, uncomplicated: Secondary | ICD-10-CM | POA: Diagnosis not present

## 2023-01-02 DIAGNOSIS — Z6825 Body mass index (BMI) 25.0-25.9, adult: Secondary | ICD-10-CM | POA: Diagnosis not present

## 2023-01-02 DIAGNOSIS — I10 Essential (primary) hypertension: Secondary | ICD-10-CM | POA: Diagnosis not present

## 2023-01-02 DIAGNOSIS — D692 Other nonthrombocytopenic purpura: Secondary | ICD-10-CM | POA: Diagnosis not present

## 2023-01-04 DIAGNOSIS — J9811 Atelectasis: Secondary | ICD-10-CM | POA: Diagnosis not present

## 2023-01-04 DIAGNOSIS — R0602 Shortness of breath: Secondary | ICD-10-CM | POA: Diagnosis not present

## 2023-01-26 DIAGNOSIS — M47896 Other spondylosis, lumbar region: Secondary | ICD-10-CM | POA: Diagnosis not present

## 2023-01-26 DIAGNOSIS — M47816 Spondylosis without myelopathy or radiculopathy, lumbar region: Secondary | ICD-10-CM | POA: Diagnosis not present

## 2023-02-14 DIAGNOSIS — M545 Low back pain, unspecified: Secondary | ICD-10-CM | POA: Diagnosis not present

## 2023-02-27 DIAGNOSIS — I10 Essential (primary) hypertension: Secondary | ICD-10-CM | POA: Diagnosis not present

## 2023-02-27 DIAGNOSIS — Z6825 Body mass index (BMI) 25.0-25.9, adult: Secondary | ICD-10-CM | POA: Diagnosis not present

## 2023-02-27 DIAGNOSIS — M10279 Drug-induced gout, unspecified ankle and foot: Secondary | ICD-10-CM | POA: Diagnosis not present

## 2023-03-13 DIAGNOSIS — M5416 Radiculopathy, lumbar region: Secondary | ICD-10-CM | POA: Diagnosis not present

## 2023-04-03 DIAGNOSIS — M10279 Drug-induced gout, unspecified ankle and foot: Secondary | ICD-10-CM | POA: Diagnosis not present

## 2023-04-03 DIAGNOSIS — I10 Essential (primary) hypertension: Secondary | ICD-10-CM | POA: Diagnosis not present

## 2023-04-03 DIAGNOSIS — D692 Other nonthrombocytopenic purpura: Secondary | ICD-10-CM | POA: Diagnosis not present

## 2023-04-03 DIAGNOSIS — I700 Atherosclerosis of aorta: Secondary | ICD-10-CM | POA: Diagnosis not present

## 2023-04-03 DIAGNOSIS — Z0000 Encounter for general adult medical examination without abnormal findings: Secondary | ICD-10-CM | POA: Diagnosis not present

## 2023-05-07 DIAGNOSIS — J40 Bronchitis, not specified as acute or chronic: Secondary | ICD-10-CM | POA: Diagnosis not present

## 2023-05-07 DIAGNOSIS — Z6825 Body mass index (BMI) 25.0-25.9, adult: Secondary | ICD-10-CM | POA: Diagnosis not present

## 2023-05-07 DIAGNOSIS — I10 Essential (primary) hypertension: Secondary | ICD-10-CM | POA: Diagnosis not present

## 2023-05-13 DIAGNOSIS — R059 Cough, unspecified: Secondary | ICD-10-CM | POA: Diagnosis not present

## 2023-05-13 DIAGNOSIS — J986 Disorders of diaphragm: Secondary | ICD-10-CM | POA: Diagnosis not present

## 2023-05-13 DIAGNOSIS — K219 Gastro-esophageal reflux disease without esophagitis: Secondary | ICD-10-CM | POA: Diagnosis not present

## 2023-05-13 DIAGNOSIS — Z888 Allergy status to other drugs, medicaments and biological substances status: Secondary | ICD-10-CM | POA: Diagnosis not present

## 2023-05-13 DIAGNOSIS — Z79899 Other long term (current) drug therapy: Secondary | ICD-10-CM | POA: Diagnosis not present

## 2023-05-13 DIAGNOSIS — R0602 Shortness of breath: Secondary | ICD-10-CM | POA: Diagnosis not present

## 2023-05-13 DIAGNOSIS — I4891 Unspecified atrial fibrillation: Secondary | ICD-10-CM | POA: Diagnosis not present

## 2023-05-13 DIAGNOSIS — I509 Heart failure, unspecified: Secondary | ICD-10-CM | POA: Diagnosis not present

## 2023-05-13 DIAGNOSIS — Z7901 Long term (current) use of anticoagulants: Secondary | ICD-10-CM | POA: Diagnosis not present

## 2023-05-13 DIAGNOSIS — Z7982 Long term (current) use of aspirin: Secondary | ICD-10-CM | POA: Diagnosis not present

## 2023-05-13 DIAGNOSIS — R918 Other nonspecific abnormal finding of lung field: Secondary | ICD-10-CM | POA: Diagnosis not present

## 2023-05-13 DIAGNOSIS — R9389 Abnormal findings on diagnostic imaging of other specified body structures: Secondary | ICD-10-CM | POA: Diagnosis not present

## 2023-05-13 DIAGNOSIS — Z792 Long term (current) use of antibiotics: Secondary | ICD-10-CM | POA: Diagnosis not present

## 2023-05-13 DIAGNOSIS — Z90710 Acquired absence of both cervix and uterus: Secondary | ICD-10-CM | POA: Diagnosis not present

## 2023-05-13 DIAGNOSIS — J9811 Atelectasis: Secondary | ICD-10-CM | POA: Diagnosis not present

## 2023-05-13 DIAGNOSIS — R0989 Other specified symptoms and signs involving the circulatory and respiratory systems: Secondary | ICD-10-CM | POA: Diagnosis not present

## 2023-05-14 DIAGNOSIS — I5031 Acute diastolic (congestive) heart failure: Secondary | ICD-10-CM | POA: Diagnosis not present

## 2023-05-14 DIAGNOSIS — Z6824 Body mass index (BMI) 24.0-24.9, adult: Secondary | ICD-10-CM | POA: Diagnosis not present

## 2023-05-14 DIAGNOSIS — I10 Essential (primary) hypertension: Secondary | ICD-10-CM | POA: Diagnosis not present

## 2023-05-29 DIAGNOSIS — I209 Angina pectoris, unspecified: Secondary | ICD-10-CM | POA: Diagnosis not present

## 2023-05-30 DIAGNOSIS — I209 Angina pectoris, unspecified: Secondary | ICD-10-CM | POA: Diagnosis not present

## 2023-06-11 DIAGNOSIS — I2510 Atherosclerotic heart disease of native coronary artery without angina pectoris: Secondary | ICD-10-CM | POA: Diagnosis not present

## 2023-06-11 DIAGNOSIS — M10279 Drug-induced gout, unspecified ankle and foot: Secondary | ICD-10-CM | POA: Diagnosis not present

## 2023-06-11 DIAGNOSIS — I700 Atherosclerosis of aorta: Secondary | ICD-10-CM | POA: Diagnosis not present

## 2023-06-11 DIAGNOSIS — I5022 Chronic systolic (congestive) heart failure: Secondary | ICD-10-CM | POA: Diagnosis not present

## 2023-08-16 DIAGNOSIS — I10 Essential (primary) hypertension: Secondary | ICD-10-CM | POA: Diagnosis not present

## 2023-08-16 DIAGNOSIS — Z6825 Body mass index (BMI) 25.0-25.9, adult: Secondary | ICD-10-CM | POA: Diagnosis not present

## 2023-08-30 DIAGNOSIS — I10 Essential (primary) hypertension: Secondary | ICD-10-CM | POA: Diagnosis not present

## 2023-08-30 DIAGNOSIS — J1520 Pneumonia due to staphylococcus, unspecified: Secondary | ICD-10-CM | POA: Diagnosis not present

## 2023-08-30 DIAGNOSIS — Z6825 Body mass index (BMI) 25.0-25.9, adult: Secondary | ICD-10-CM | POA: Diagnosis not present

## 2023-09-08 DIAGNOSIS — J440 Chronic obstructive pulmonary disease with acute lower respiratory infection: Secondary | ICD-10-CM | POA: Diagnosis not present

## 2023-09-10 DIAGNOSIS — J180 Bronchopneumonia, unspecified organism: Secondary | ICD-10-CM | POA: Diagnosis not present

## 2023-09-10 DIAGNOSIS — M4726 Other spondylosis with radiculopathy, lumbar region: Secondary | ICD-10-CM | POA: Diagnosis not present

## 2023-09-10 DIAGNOSIS — G8929 Other chronic pain: Secondary | ICD-10-CM | POA: Diagnosis not present

## 2023-09-14 DIAGNOSIS — M19011 Primary osteoarthritis, right shoulder: Secondary | ICD-10-CM | POA: Diagnosis not present

## 2023-09-14 DIAGNOSIS — M25511 Pain in right shoulder: Secondary | ICD-10-CM | POA: Diagnosis not present

## 2023-09-14 DIAGNOSIS — M19012 Primary osteoarthritis, left shoulder: Secondary | ICD-10-CM | POA: Diagnosis not present

## 2023-09-14 DIAGNOSIS — M25512 Pain in left shoulder: Secondary | ICD-10-CM | POA: Diagnosis not present

## 2023-10-05 DIAGNOSIS — M4854XA Collapsed vertebra, not elsewhere classified, thoracic region, initial encounter for fracture: Secondary | ICD-10-CM | POA: Diagnosis not present

## 2023-10-05 DIAGNOSIS — Z8673 Personal history of transient ischemic attack (TIA), and cerebral infarction without residual deficits: Secondary | ICD-10-CM | POA: Diagnosis not present

## 2023-10-05 DIAGNOSIS — S299XXA Unspecified injury of thorax, initial encounter: Secondary | ICD-10-CM | POA: Diagnosis not present

## 2023-10-05 DIAGNOSIS — S3991XA Unspecified injury of abdomen, initial encounter: Secondary | ICD-10-CM | POA: Diagnosis not present

## 2023-10-05 DIAGNOSIS — S0990XA Unspecified injury of head, initial encounter: Secondary | ICD-10-CM | POA: Diagnosis not present

## 2023-10-05 DIAGNOSIS — S0003XA Contusion of scalp, initial encounter: Secondary | ICD-10-CM | POA: Diagnosis not present

## 2023-10-05 DIAGNOSIS — S29009A Unspecified injury of muscle and tendon of unspecified wall of thorax, initial encounter: Secondary | ICD-10-CM | POA: Diagnosis not present

## 2023-10-05 DIAGNOSIS — R0989 Other specified symptoms and signs involving the circulatory and respiratory systems: Secondary | ICD-10-CM | POA: Diagnosis not present

## 2023-10-05 DIAGNOSIS — K219 Gastro-esophageal reflux disease without esophagitis: Secondary | ICD-10-CM | POA: Diagnosis not present

## 2023-10-05 DIAGNOSIS — I4891 Unspecified atrial fibrillation: Secondary | ICD-10-CM | POA: Diagnosis not present

## 2023-10-05 DIAGNOSIS — Z90710 Acquired absence of both cervix and uterus: Secondary | ICD-10-CM | POA: Diagnosis not present

## 2023-10-05 DIAGNOSIS — W19XXXA Unspecified fall, initial encounter: Secondary | ICD-10-CM | POA: Diagnosis not present

## 2023-10-05 DIAGNOSIS — S199XXA Unspecified injury of neck, initial encounter: Secondary | ICD-10-CM | POA: Diagnosis not present

## 2023-10-05 DIAGNOSIS — J9811 Atelectasis: Secondary | ICD-10-CM | POA: Diagnosis not present

## 2023-10-05 DIAGNOSIS — G319 Degenerative disease of nervous system, unspecified: Secondary | ICD-10-CM | POA: Diagnosis not present

## 2023-10-05 DIAGNOSIS — R0789 Other chest pain: Secondary | ICD-10-CM | POA: Diagnosis not present

## 2023-10-05 DIAGNOSIS — R29890 Loss of height: Secondary | ICD-10-CM | POA: Diagnosis not present

## 2023-10-05 DIAGNOSIS — Y92009 Unspecified place in unspecified non-institutional (private) residence as the place of occurrence of the external cause: Secondary | ICD-10-CM | POA: Diagnosis not present

## 2023-10-05 DIAGNOSIS — R0781 Pleurodynia: Secondary | ICD-10-CM | POA: Diagnosis not present

## 2023-10-05 DIAGNOSIS — J986 Disorders of diaphragm: Secondary | ICD-10-CM | POA: Diagnosis not present

## 2023-10-05 DIAGNOSIS — S3993XA Unspecified injury of pelvis, initial encounter: Secondary | ICD-10-CM | POA: Diagnosis not present

## 2023-10-05 DIAGNOSIS — M40205 Unspecified kyphosis, thoracolumbar region: Secondary | ICD-10-CM | POA: Diagnosis not present

## 2023-10-05 DIAGNOSIS — M4856XA Collapsed vertebra, not elsewhere classified, lumbar region, initial encounter for fracture: Secondary | ICD-10-CM | POA: Diagnosis not present

## 2023-10-05 DIAGNOSIS — I6523 Occlusion and stenosis of bilateral carotid arteries: Secondary | ICD-10-CM | POA: Diagnosis not present

## 2023-10-05 DIAGNOSIS — I6522 Occlusion and stenosis of left carotid artery: Secondary | ICD-10-CM | POA: Diagnosis not present

## 2023-10-06 DIAGNOSIS — J440 Chronic obstructive pulmonary disease with acute lower respiratory infection: Secondary | ICD-10-CM | POA: Diagnosis not present

## 2023-10-16 DIAGNOSIS — I6522 Occlusion and stenosis of left carotid artery: Secondary | ICD-10-CM | POA: Diagnosis not present

## 2023-10-29 DIAGNOSIS — I10 Essential (primary) hypertension: Secondary | ICD-10-CM | POA: Diagnosis not present

## 2023-10-29 DIAGNOSIS — L600 Ingrowing nail: Secondary | ICD-10-CM | POA: Diagnosis not present

## 2023-10-29 DIAGNOSIS — Z1329 Encounter for screening for other suspected endocrine disorder: Secondary | ICD-10-CM | POA: Diagnosis not present

## 2023-10-29 DIAGNOSIS — Z0000 Encounter for general adult medical examination without abnormal findings: Secondary | ICD-10-CM | POA: Diagnosis not present

## 2023-10-29 DIAGNOSIS — E559 Vitamin D deficiency, unspecified: Secondary | ICD-10-CM | POA: Diagnosis not present

## 2023-10-29 DIAGNOSIS — E876 Hypokalemia: Secondary | ICD-10-CM | POA: Diagnosis not present

## 2023-10-29 DIAGNOSIS — K219 Gastro-esophageal reflux disease without esophagitis: Secondary | ICD-10-CM | POA: Diagnosis not present

## 2023-10-29 DIAGNOSIS — I6522 Occlusion and stenosis of left carotid artery: Secondary | ICD-10-CM | POA: Diagnosis not present

## 2023-10-29 DIAGNOSIS — I480 Paroxysmal atrial fibrillation: Secondary | ICD-10-CM | POA: Diagnosis not present

## 2023-10-29 DIAGNOSIS — E782 Mixed hyperlipidemia: Secondary | ICD-10-CM | POA: Diagnosis not present

## 2023-11-12 DIAGNOSIS — R32 Unspecified urinary incontinence: Secondary | ICD-10-CM | POA: Diagnosis not present

## 2023-11-12 DIAGNOSIS — D6869 Other thrombophilia: Secondary | ICD-10-CM | POA: Diagnosis not present

## 2023-11-12 DIAGNOSIS — H269 Unspecified cataract: Secondary | ICD-10-CM | POA: Diagnosis not present

## 2023-11-12 DIAGNOSIS — Z7902 Long term (current) use of antithrombotics/antiplatelets: Secondary | ICD-10-CM | POA: Diagnosis not present

## 2023-11-12 DIAGNOSIS — I6522 Occlusion and stenosis of left carotid artery: Secondary | ICD-10-CM | POA: Diagnosis not present

## 2023-11-12 DIAGNOSIS — E785 Hyperlipidemia, unspecified: Secondary | ICD-10-CM | POA: Diagnosis not present

## 2023-11-12 DIAGNOSIS — I480 Paroxysmal atrial fibrillation: Secondary | ICD-10-CM | POA: Diagnosis not present

## 2023-11-12 DIAGNOSIS — I10 Essential (primary) hypertension: Secondary | ICD-10-CM | POA: Diagnosis not present

## 2023-11-12 DIAGNOSIS — Z7982 Long term (current) use of aspirin: Secondary | ICD-10-CM | POA: Diagnosis not present

## 2023-11-12 DIAGNOSIS — K219 Gastro-esophageal reflux disease without esophagitis: Secondary | ICD-10-CM | POA: Diagnosis not present

## 2023-11-18 ENCOUNTER — Observation Stay
Admission: EM | Admit: 2023-11-18 | Discharge: 2023-11-20 | Disposition: A | Discharge status: Home / Self Care | Attending: Internal Medicine | Admitting: Internal Medicine

## 2023-11-18 ENCOUNTER — Emergency Department

## 2023-11-18 ENCOUNTER — Encounter: Payer: Self-pay | Admitting: *Deleted

## 2023-11-18 ENCOUNTER — Other Ambulatory Visit: Payer: Self-pay

## 2023-11-18 DIAGNOSIS — R0689 Other abnormalities of breathing: Secondary | ICD-10-CM | POA: Diagnosis not present

## 2023-11-18 DIAGNOSIS — J449 Chronic obstructive pulmonary disease, unspecified: Secondary | ICD-10-CM | POA: Diagnosis not present

## 2023-11-18 DIAGNOSIS — R001 Bradycardia, unspecified: Secondary | ICD-10-CM | POA: Insufficient documentation

## 2023-11-18 DIAGNOSIS — Z8673 Personal history of transient ischemic attack (TIA), and cerebral infarction without residual deficits: Secondary | ICD-10-CM | POA: Diagnosis not present

## 2023-11-18 DIAGNOSIS — Z96651 Presence of right artificial knee joint: Secondary | ICD-10-CM | POA: Insufficient documentation

## 2023-11-18 DIAGNOSIS — Z1152 Encounter for screening for COVID-19: Secondary | ICD-10-CM | POA: Diagnosis not present

## 2023-11-18 DIAGNOSIS — I10 Essential (primary) hypertension: Secondary | ICD-10-CM | POA: Diagnosis not present

## 2023-11-18 DIAGNOSIS — R0600 Dyspnea, unspecified: Secondary | ICD-10-CM | POA: Diagnosis not present

## 2023-11-18 DIAGNOSIS — Z8781 Personal history of (healed) traumatic fracture: Secondary | ICD-10-CM

## 2023-11-18 DIAGNOSIS — J9811 Atelectasis: Secondary | ICD-10-CM | POA: Diagnosis not present

## 2023-11-18 DIAGNOSIS — Z789 Other specified health status: Secondary | ICD-10-CM | POA: Insufficient documentation

## 2023-11-18 DIAGNOSIS — R0602 Shortness of breath: Principal | ICD-10-CM

## 2023-11-18 DIAGNOSIS — Z95810 Presence of automatic (implantable) cardiac defibrillator: Secondary | ICD-10-CM | POA: Diagnosis not present

## 2023-11-18 DIAGNOSIS — R079 Chest pain, unspecified: Secondary | ICD-10-CM | POA: Insufficient documentation

## 2023-11-18 DIAGNOSIS — I959 Hypotension, unspecified: Secondary | ICD-10-CM | POA: Diagnosis not present

## 2023-11-18 DIAGNOSIS — R0989 Other specified symptoms and signs involving the circulatory and respiratory systems: Secondary | ICD-10-CM | POA: Diagnosis not present

## 2023-11-18 DIAGNOSIS — Z85828 Personal history of other malignant neoplasm of skin: Secondary | ICD-10-CM | POA: Insufficient documentation

## 2023-11-18 DIAGNOSIS — Z79899 Other long term (current) drug therapy: Secondary | ICD-10-CM | POA: Insufficient documentation

## 2023-11-18 DIAGNOSIS — R918 Other nonspecific abnormal finding of lung field: Secondary | ICD-10-CM | POA: Diagnosis not present

## 2023-11-18 DIAGNOSIS — I2510 Atherosclerotic heart disease of native coronary artery without angina pectoris: Secondary | ICD-10-CM | POA: Diagnosis not present

## 2023-11-18 DIAGNOSIS — I4891 Unspecified atrial fibrillation: Secondary | ICD-10-CM | POA: Diagnosis not present

## 2023-11-18 DIAGNOSIS — Z95828 Presence of other vascular implants and grafts: Secondary | ICD-10-CM

## 2023-11-18 DIAGNOSIS — I480 Paroxysmal atrial fibrillation: Secondary | ICD-10-CM | POA: Diagnosis not present

## 2023-11-18 DIAGNOSIS — Z7409 Other reduced mobility: Secondary | ICD-10-CM | POA: Insufficient documentation

## 2023-11-18 LAB — CBC WITH DIFFERENTIAL/PLATELET
Abs Immature Granulocytes: 0.03 10*3/uL (ref 0.00–0.07)
Basophils Absolute: 0.1 10*3/uL (ref 0.0–0.1)
Basophils Relative: 1 %
Eosinophils Absolute: 0.2 10*3/uL (ref 0.0–0.5)
Eosinophils Relative: 2 %
HCT: 31 % — ABNORMAL LOW (ref 36.0–46.0)
Hemoglobin: 10.1 g/dL — ABNORMAL LOW (ref 12.0–15.0)
Immature Granulocytes: 0 %
Lymphocytes Relative: 19 %
Lymphs Abs: 1.4 10*3/uL (ref 0.7–4.0)
MCH: 34.4 pg — ABNORMAL HIGH (ref 26.0–34.0)
MCHC: 32.6 g/dL (ref 30.0–36.0)
MCV: 105.4 fL — ABNORMAL HIGH (ref 80.0–100.0)
Monocytes Absolute: 0.6 10*3/uL (ref 0.1–1.0)
Monocytes Relative: 8 %
Neutro Abs: 5.1 10*3/uL (ref 1.7–7.7)
Neutrophils Relative %: 70 %
Platelets: 140 10*3/uL — ABNORMAL LOW (ref 150–400)
RBC: 2.94 MIL/uL — ABNORMAL LOW (ref 3.87–5.11)
RDW: 14.1 % (ref 11.5–15.5)
WBC: 7.4 10*3/uL (ref 4.0–10.5)
nRBC: 0 % (ref 0.0–0.2)

## 2023-11-18 LAB — TROPONIN I (HIGH SENSITIVITY)
Troponin I (High Sensitivity): 11 ng/L (ref <18)
Troponin I (High Sensitivity): 17 ng/L (ref <18)

## 2023-11-18 LAB — BASIC METABOLIC PANEL WITH GFR
Anion gap: 6 (ref 5–15)
BUN: 19 mg/dL (ref 8–23)
CO2: 31 mmol/L (ref 22–32)
Calcium: 9.2 mg/dL (ref 8.9–10.3)
Chloride: 105 mmol/L (ref 98–111)
Creatinine, Ser: 0.87 mg/dL (ref 0.44–1.00)
GFR, Estimated: >60 mL/min (ref >60)
Glucose, Bld: 161 mg/dL — ABNORMAL HIGH (ref 70–99)
Potassium: 3.8 mmol/L (ref 3.5–5.1)
Sodium: 142 mmol/L (ref 135–145)

## 2023-11-18 LAB — MAGNESIUM: Magnesium: 1.8 mg/dL (ref 1.7–2.4)

## 2023-11-18 LAB — D-DIMER, QUANTITATIVE: D-Dimer, Quant: 1.84 ug{FEU}/mL — ABNORMAL HIGH (ref 0.00–0.50)

## 2023-11-18 LAB — RESP PANEL BY RT-PCR (RSV, FLU A&B, COVID)  RVPGX2
Influenza A by PCR: NEGATIVE
Influenza B by PCR: NEGATIVE
Resp Syncytial Virus by PCR: NEGATIVE
SARS Coronavirus 2 by RT PCR: NEGATIVE

## 2023-11-18 LAB — PROCALCITONIN: Procalcitonin: <0.1 ng/mL

## 2023-11-18 LAB — BRAIN NATRIURETIC PEPTIDE: B Natriuretic Peptide: 845 pg/mL — ABNORMAL HIGH (ref 0.0–100.0)

## 2023-11-18 MED ORDER — ENOXAPARIN SODIUM 40 MG/0.4ML IJ SOSY
40.0000 mg | PREFILLED_SYRINGE | INTRAMUSCULAR | Status: DC
  Administered 2023-11-18 – 2023-11-19 (×2): 40 mg via SUBCUTANEOUS
  Filled 2023-11-18 (×2): qty 0.4

## 2023-11-18 MED ORDER — SODIUM CHLORIDE 0.9 % IV SOLN
1.0000 g | Freq: Once | INTRAVENOUS | Status: AC
  Administered 2023-11-18: 1 g via INTRAVENOUS
  Filled 2023-11-18: qty 10

## 2023-11-18 MED ORDER — ONDANSETRON HCL 4 MG PO TABS: 4.0000 mg | ORAL_TABLET | Freq: Four times a day (QID) | ORAL | Status: DC | PRN

## 2023-11-18 MED ORDER — ONDANSETRON HCL 4 MG/2ML IJ SOLN: 4.0000 mg | Freq: Four times a day (QID) | INTRAMUSCULAR | Status: DC | PRN

## 2023-11-18 MED ORDER — FUROSEMIDE 10 MG/ML IJ SOLN
40.0000 mg | Freq: Two times a day (BID) | INTRAMUSCULAR | Status: DC
  Administered 2023-11-19: 40 mg via INTRAVENOUS
  Filled 2023-11-18: qty 4

## 2023-11-18 MED ORDER — ACETAMINOPHEN 650 MG RE SUPP: 650.0000 mg | Freq: Four times a day (QID) | RECTAL | Status: DC | PRN

## 2023-11-18 MED ORDER — IPRATROPIUM-ALBUTEROL 0.5-2.5 (3) MG/3ML IN SOLN
3.0000 mL | Freq: Once | RESPIRATORY_TRACT | Status: AC
  Administered 2023-11-18: 3 mL via RESPIRATORY_TRACT
  Filled 2023-11-18: qty 3

## 2023-11-18 MED ORDER — IOHEXOL 350 MG/ML SOLN
75.0000 mL | Freq: Once | INTRAVENOUS | Status: AC | PRN
  Administered 2023-11-18: 75 mL via INTRAVENOUS

## 2023-11-18 MED ORDER — FUROSEMIDE 10 MG/ML IJ SOLN
40.0000 mg | Freq: Once | INTRAMUSCULAR | Status: AC
  Administered 2023-11-18: 40 mg via INTRAVENOUS
  Filled 2023-11-18: qty 4

## 2023-11-18 MED ORDER — SODIUM CHLORIDE 0.9 % IV SOLN
500.0000 mg | Freq: Once | INTRAVENOUS | Status: AC
  Administered 2023-11-18: 500 mg via INTRAVENOUS
  Filled 2023-11-18: qty 5

## 2023-11-18 MED ORDER — ACETAMINOPHEN 325 MG PO TABS: 650.0000 mg | ORAL_TABLET | Freq: Four times a day (QID) | ORAL | Status: DC | PRN

## 2023-11-18 MED ORDER — ALBUTEROL SULFATE (2.5 MG/3ML) 0.083% IN NEBU: 2.5000 mg | INHALATION_SOLUTION | RESPIRATORY_TRACT | Status: DC | PRN

## 2023-11-18 MED ORDER — HYDROCODONE-ACETAMINOPHEN 5-325 MG PO TABS: 1.0000 | ORAL_TABLET | ORAL | Status: DC | PRN

## 2023-11-18 NOTE — Assessment & Plan Note (Signed)
 Currently on DAPT No acute applications suspected

## 2023-11-18 NOTE — Assessment & Plan Note (Signed)
 Likely secondary to metoprolol  Will hold metoprolol  tonight

## 2023-11-18 NOTE — ED Provider Notes (Signed)
 [facility] Provider Note    Event Date/Time   First MD Initiated Contact with Patient 11/18/23 1654     (approximate)   History   Chief Complaint Shortness of Breath   HPI  Alisha Murphy is a 89 y.o. female with past medical history of hypertension, atrial fibrillation, stroke, and COPD who presents to the ED complaining of shortness of breath.  Patient reports that for the past week she has been feeling slightly short of breath, however symptoms got acutely worse today.  She describes some sharp discomfort in the left side of her chest underneath her left breast, which radiates towards her right breast.  She denies any associated fevers or cough, has noticed some mild swelling in her legs but denies any pain in either leg.  She did have carotid stent placement at [facility] 3 days ago, denies any complications but states symptoms predated her procedure.     Physical Exam   Triage Vital Signs: ED Triage Vitals  Encounter Vitals Group     BP      Systolic BP Percentile      Diastolic BP Percentile      Pulse      Resp      Temp      Temp src      SpO2      Weight      Height      Head Circumference      Peak Flow      Pain Score      Pain Loc      Pain Education      Exclude from Growth Chart     Most recent vital signs: Vitals:   11/18/23 1914 11/18/23 2038  BP: (!) 134/51 (!) 151/62  Pulse: (!) 54 (!) 55  Resp: 20 19  Temp: 97.6 F (36.4 C)   SpO2: 100% 99%    Constitutional: Alert and oriented. Eyes: Conjunctivae are normal. Head: Atraumatic. Nose: No congestion/rhinnorhea. Mouth/Throat: Mucous membranes are moist.  Neck: Surgical site to left neck with sutures intact, mild edema with no erythema, warmth, or tenderness noted. Cardiovascular: Normal rate, regular rhythm. Grossly normal heart sounds.  2+ radial pulses bilaterally. Respiratory: Tachypneic with increased respiratory effort.  No retractions. Lung sounds diminished  bilaterally. Gastrointestinal: Soft and nontender. No distention. Musculoskeletal: Trace pitting edema to bilateral lower extremities with no associated tenderness. Neurologic:  Normal speech and language. No gross focal neurologic deficits are appreciated.    ED Results / Procedures / Treatments   Labs (all labs ordered are listed, but only abnormal results are displayed) Labs Reviewed  CBC WITH DIFFERENTIAL/PLATELET - Abnormal; Notable for the following components:      Result Value   RBC 2.94 (*)    Hemoglobin 10.1 (*)    HCT 31.0 (*)    MCV 105.4 (*)    MCH 34.4 (*)    Platelets 140 (*)    All other components within normal limits  BASIC METABOLIC PANEL WITH GFR - Abnormal; Notable for the following components:   Glucose, Bld 161 (*)    All other components within normal limits  BRAIN NATRIURETIC PEPTIDE - Abnormal; Notable for the following components:   B Natriuretic Peptide 845.0 (*)    All other components within normal limits  D-DIMER, QUANTITATIVE - Abnormal; Notable for the following components:   D-Dimer, Quant 1.84 (*)    All other components within normal limits  RESP PANEL BY RT-PCR (RSV, FLU  A&B, COVID)  RVPGX2  MAGNESIUM   PROCALCITONIN  TROPONIN I (HIGH SENSITIVITY)  TROPONIN I (HIGH SENSITIVITY)     EKG  ED ECG REPORT I, Twilla Galea, the attending physician, personally viewed and interpreted this ECG.   Date: 11/18/2023  EKG Time: 17:24  Rate: 50  Rhythm: sinus bradycardia, frequent PVC's noted  Axis: Normal  Intervals:none  ST&T Change: None  RADIOLOGY Chest x-ray reviewed and interpreted by me with bilateral lobe infiltrates versus edema.  PROCEDURES:  Critical Care performed: No  Procedures   MEDICATIONS ORDERED IN ED: Medications  ipratropium-albuterol  (DUONEB) 0.5-2.5 (3) MG/3ML nebulizer solution 3 mL (3 mLs Nebulization Given 11/18/23 1710)  cefTRIAXone  (ROCEPHIN ) 1 g in sodium chloride  0.9 % 100 mL IVPB (0 g Intravenous  Stopped 11/18/23 2028)  azithromycin (ZITHROMAX) 500 mg in sodium chloride  0.9 % 250 mL IVPB (0 mg Intravenous Stopped 11/18/23 2140)  furosemide  (LASIX ) injection 40 mg (40 mg Intravenous Given 11/18/23 2030)  iohexol (OMNIPAQUE) 350 MG/ML injection 75 mL (75 mLs Intravenous Contrast Given 11/18/23 2218)     IMPRESSION / MDM / ASSESSMENT AND PLAN / ED COURSE  I reviewed the triage vital signs and the nursing notes.                              89 y.o. female with past medical history of hypertension, atrial fibrillation, stroke, and COPD who presents to the ED complaining of 1 week of increasing difficulty breathing, acutely worse today with some sharp pain under her left breast.  Patient's presentation is most consistent with acute presentation with potential threat to life or bodily function.  Differential diagnosis includes, but is not limited to, ACS, PE, pneumonia, pneumothorax, musculoskeletal pain, GERD, anxiety, CHF, COPD.  Patient nontoxic-appearing and in no acute distress, vital signs are remarkable for tachypnea but otherwise reassuring.  She is tachypneic with increased work of breathing, diminished breath sounds noted bilaterally but she is maintaining oxygen saturations on room air.  Will trial DuoNeb and further assess with EKG, chest x-ray, and labs.  Labs without significant anemia, leukocytosis, electrolyte abnormality, or AKI.  2 sets of troponin are within normal limits, BNP does appear elevated concerning for CHF.  Chest x-ray consistent with pneumonia versus pulmonary edema, patient given dose of IV antibiotics but with negative procalcitonin, suspect pulmonary edema and will diurese with IV Lasix .  D-dimer noted to be elevated and CTA chest performed, radiology read pending at this time but no large pulmonary embolism by my read.  Case discussed with hospitalist for admission.      FINAL CLINICAL IMPRESSION(S) / ED DIAGNOSES   Final diagnoses:  SOB (shortness of  breath)     Rx / DC Orders   ED Discharge Orders     None        Note:  This document was prepared using Dragon voice recognition software and may include unintentional dictation errors.   Twilla Galea, MD 11/18/23 2229

## 2023-11-18 NOTE — ED Notes (Signed)
 Patient transported to CT

## 2023-11-18 NOTE — Assessment & Plan Note (Signed)
 On aspirin

## 2023-11-18 NOTE — Assessment & Plan Note (Addendum)
 Suspect acute CHF  BNP elevated to the 800s CTA chest negative for acute PE or pneumonia ACS unlikely given negative troponins and nonacute EKG Continue IV Lasix  Daily weights with intake and output monitoring Echocardiogram in the a.m.--(patient had a normal echo back in November 2020 follow-up with [facility] that was indicated for anginal symptoms) Can consider cardiology consult

## 2023-11-18 NOTE — Assessment & Plan Note (Signed)
 No acute issues suspected Routine pain control

## 2023-11-18 NOTE — H&P (Signed)
 History and Physical    Patient: Alisha Murphy:[id number] DOB: January 08, 1935 DOA: 11/18/2023 DOS: the patient was seen and examined on 11/18/2023 PCP: Dewitt Forehand, MD  Patient coming from: Home  Chief Complaint:  Chief Complaint  Patient presents with   Shortness of Breath    HPI: Alisha Murphy is a 89 y.o. female with medical history significant for Hypertension, A-fib not on DOAC due to remote history of GI bleed, COPD, s/p left internal carotid artery done on 4/25 at [facility] who presents to the ED with a 1 week history of shortness of breath that has been progressively worsening.  Additionally she has ongoing left-sided chest pain felt under her left breast that has been present since a fall back in March for which she had a workup at [facility] emergency room with pan scan revealing thoracic and lumbar compression fractures.  She denies cough, fever or chills.  Does endorse some lower extremity edema. ED course and data review: Bradycardia to the high 40s and low 50s, tachypneic to the low 20s with otherwise unremarkable vitals Labs notable for the following: Troponin 11--17 and BNP 845, D-dimer 1.84 BMP unremarkable CBC with hemoglobin 10.1, down from 10.7 on 4/25 but down from 12.6 in March WBC normal and procalcitonin less than 0.10 Respiratory viral panel negative for COVID flu and RSV  EKG with sinus bradycardia and PVCs  CTA chest PE protocol negative for PE, showing prior compression deformities, scattered mild groundglass opacities favoring scattered atelectasis (on chest x-ray-atelectasis versus pneumonia close (.  Eventration of right hemidiaphragm also noted  Patient was treated with Rocephin  and azithromycin in the ED and also given a dose of Lasix   Hospitalist consulted for admission.     Review of Systems: As mentioned in the history of present illness. All other systems reviewed and are negative.  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 She has never used smokeless tobacco. She reports that she does not drink alcohol and does not use drugs.  Allergies  Allergen Reactions   Doxycycline Rash    Family History  Problem Relation Age of Onset   Cancer Mother    Heart Problems Father     Prior to Admission medications   Medication Sig Start Date End Date Taking? Authorizing Provider  albuterol  (VENTOLIN  HFA) 108 (90 Base) MCG/ACT inhaler Inhale 1-2 puffs into the lungs every 6 (six) hours as needed for wheezing or shortness of breath. 07/28/20  Yes [provider]  spironolactone (ALDACTONE) 25 MG tablet Take 1 tablet by mouth daily. 09/27/23  Yes [provider]  Calcium Carbonate-Vitamin D (CALTRATE 600+D PO) Take 1 tablet by mouth daily.    [provider]  cholecalciferol (VITAMIN D3) 25 MCG (1000 UNIT) tablet Take 1,000 Units by mouth in the morning.    [provider]  Fluticasone -Umeclidin-Vilant (TRELEGY ELLIPTA) 100-62.5-25 MCG/ACT AEPB Inhale 1 puff into the lungs daily as needed.    [provider]  gabapentin   (NEURONTIN ) 300 MG capsule Take 300 mg by mouth 3 (three) times daily.    [provider]  methocarbamol  (ROBAXIN ) 500 MG tablet Take 1 tablet (500 mg total) by mouth 4 (four) times daily. 06/14/22   Samara Crest, PA  metoprolol  succinate (TOPROL -XL) 100 MG 24 hr tablet Take 100 mg by mouth in the morning. 02/19/19   [provider]  potassium chloride  (KLOR-CON  M) 10 MEQ tablet Take 10 mEq by mouth in the morning.    [provider]  potassium chloride  (KLOR-CON ) 10 MEQ tablet Take 10 mEq by mouth daily. 05/12/22   [provider]  senna-docusate (SENOKOT-S) 8.6-50 MG tablet Take 2 tablets by mouth in the morning.    [provider]  traMADol  (ULTRAM ) 50 MG tablet Take 1 tablet (50 mg total) by mouth every 12 (twelve) hours as needed. Patient taking differently: Take 50 mg by mouth every 12 (twelve) hours as needed (pain.). 08/13/20   Adah Acron, MD    Physical Exam: Vitals:   11/18/23 1845 11/18/23 1900 11/18/23 1914 11/18/23 2038  BP: (!) 137/52 (!) 134/51 (!) 134/51 (!) 151/62  Pulse:   (!) 54 (!) 55  Resp: 17 (!) 24 20 19   Temp:   97.6 F (36.4 C)   TempSrc:   Oral   SpO2: 100% 95% 100% 99%  Weight:      Height:       Physical Exam Vitals and nursing note reviewed.  Constitutional:      General: She is not in acute distress. HENT:     Head: Normocephalic and atraumatic.  Cardiovascular:     Rate and Rhythm: Regular rhythm. Bradycardia present.     Heart sounds: Normal heart sounds.  Pulmonary:     Effort: Pulmonary effort is normal.     Breath sounds: Normal breath sounds.  Abdominal:     Palpations: Abdomen is soft.     Tenderness: There is no abdominal tenderness.  Neurological:     Mental Status: Mental status is at baseline.     Labs on Admission: I have personally reviewed following labs and imaging studies  CBC: Recent Labs  Lab 11/18/23 1659  WBC 7.4  NEUTROABS 5.1  HGB 10.1*  HCT 31.0*  MCV 105.4*  PLT  140*   Basic Metabolic Panel: Recent Labs  Lab 11/18/23 1659  NA 142  K 3.8  CL 105  CO2 31  GLUCOSE 161*  BUN 19  CREATININE 0.87  CALCIUM 9.2  MG 1.8   GFR: Estimated Creatinine Clearance: 40.9 mL/min (by C-G formula based on SCr of 0.87 mg/dL). Liver Function Tests: No results for input(s): "AST", "ALT", "ALKPHOS", "BILITOT", "PROT", "ALBUMIN" in the last 168 hours. No results for input(s): "LIPASE", "AMYLASE" in the last 168 hours. No results for input(s): "AMMONIA" in the last 168 hours. Coagulation Profile: No results for input(s): "INR", "PROTIME" in the last 168 hours. Cardiac Enzymes: No results for input(s): "CKTOTAL", "CKMB", "CKMBINDEX", "TROPONINI" in the last  168 hours. BNP (last 3 results) No results for input(s): "PROBNP" in the last 8760 hours. HbA1C: No results for input(s): "HGBA1C" in the last 72 hours. CBG: No results for input(s): "GLUCAP" in the last 168 hours. Lipid Profile: No results for input(s): "CHOL", "HDL", "LDLCALC", "TRIG", "CHOLHDL", "LDLDIRECT" in the last 72 hours. Thyroid  Function Tests: No results for input(s): "TSH", "T4TOTAL", "FREET4", "T3FREE", "THYROIDAB" in the last 72 hours. Anemia Panel: No results for input(s): "VITAMINB12", "FOLATE", "FERRITIN", "TIBC", "IRON", "RETICCTPCT" in the last 72 hours. Urine analysis: No results found for: "COLORURINE", "APPEARANCEUR", "LABSPEC", "PHURINE", "GLUCOSEU", "HGBUR", "BILIRUBINUR", "KETONESUR", "PROTEINUR", "UROBILINOGEN", "NITRITE", "LEUKOCYTESUR"  Radiological Exams on Admission: CT Angio Chest PE W/Cm &/Or Wo Cm Result Date: 11/18/2023 EXAM: CTA CHEST PE 11/18/2023 10:27:45 PM TECHNIQUE: CTA of the chest was performed without and with intravenous contrast (75mL iohexol (OMNIPAQUE) 350 MG/ML injection). Multiplanar reformatted images are provided for review. MIP images are provided for review. Automated exposure control, iterative reconstruction, and/or weight based adjustment of the  mA/kV was utilized to reduce the radiation dose to as low as reasonably achievable. COMPARISON: Chest radiograph earlier today and CT chest 05/13/2023. CLINICAL HISTORY: Pulmonary embolism (PE) suspected, low to intermediate probability, positive D-dimer. Patient presents with shortness of breath progressively worse over the last week. Alert, no acute distress, calm, interactive, speaking in clear complete sentences, vital signs stable. Denies cough, fever, or pain. Recent fall with right leg lateral calf bruise. Denies loss of consciousness or head trauma. On blood thinner for atrial fibrillation. Recent anterior neck surgery with unremarkable incision and noted bruising to neck. FINDINGS: PULMONARY ARTERIES: Pulmonary arteries are adequately opacified for evaluation. No evidence of pulmonary embolism. Main pulmonary artery is normal in caliber. MEDIASTINUM: No evidence of mediastinal lymphadenopathy. The heart and pericardium demonstrate no acute abnormality. Atherosclerotic calcifications of the aortic arch. Mild 3-vessel coronary atherosclerosis. LYMPH NODES: No evidence of mediastinal, hilar or axillary lymphadenopathy. LUNGS AND PLEURA: Scattered mild ground-glass opacity/mosaic attenuation (image 23), favoring scattered atelectasis. No evidence of pleural effusion or pneumothorax. Eventration of the right hemidiaphragm. UPPER ABDOMEN: Limited images of the upper abdomen are unremarkable. SOFT TISSUES AND BONES: Mild superior endplate compression fracture deformities at T4 and T6. Moderate superoinferior compression fracture deformity at L1, chronic. No acute soft tissue abnormality. IMPRESSION: 1. No evidence of pulmonary embolism. 2. Eventration of the right hemidiaphragm with scattered atelectasis. Electronically signed by: Zadie Herter MD 11/18/2023 10:34 PM EDT RP Workstation: [id number]   DG Chest Portable 1 View Result Date: 11/18/2023 EXAM: 1 VIEW(S) XRAY OF THE CHEST 11/18/2023 05:09:00 PM  COMPARISON: CT chest 05/13/2023 CLINICAL HISTORY: SOB. Increased shortness of breath. FINDINGS: LUNGS AND PLEURA: Low lung volumes with patchy bilateral lower lobe opacities, atelectasis versus pneumonia. HEART AND MEDIASTINUM: No acute abnormality of the cardiac and mediastinal silhouettes. BONES AND SOFT TISSUES: No acute osseous abnormality. DIAPHRAGM: Eventration of the right hemidiaphragm. IMPRESSION: 1. Low lung volumes. Patchy bilateral lower lobe opacities, atelectasis versus pneumonia. 2. Eventration of the right hemidiaphragm. Electronically signed by: Zadie Herter MD 11/18/2023 07:23 PM EDT RP Workstation: [id number]   Data Reviewed for HPI: Relevant notes from primary care and specialist visits, past discharge summaries as available in EHR, including Care Everywhere. Prior diagnostic testing as pertinent to current admission diagnoses Updated medications and problem lists for reconciliation ED course, including vitals, labs, imaging, treatment and response to treatment Triage notes, nursing and pharmacy notes and ED provider's notes Notable results as noted above in HPI      Assessment and  Plan: * Acute dyspnea Possible acute CHF  BNP elevated to the 800s CTA chest negative for acute PE or pneumonia ACS unlikely given negative troponins and nonacute EKG Continue IV Lasix  Daily weights with intake and output monitoring Echocardiogram in the a.m.--(patient had a normal echo back in November 2020 follow-up with [facility] that was indicated for anginal symptoms) Can consider cardiology consult    Left-sided chest pain, recurrent Suspecting noncardiac chest pain Having left-sided chest pain for which she was seen in [facility] ED with workup showing multiple compression vertebral fractures at different stages Had echo November 2024 with [facility] system to evaluate for anginal symptoms Troponins and EKG not in keeping with ACS Pain control Consider cardiology consult  S/p internal carotid  stent placement 11/16/23 Currently on DAPT No acute applications suspected  PAF (paroxysmal atrial fibrillation) (HCC) On aspirin   History of ischemic stroke Continue DAPT No acute issues suspected  History of thoracic and lumbar vertebral compression fracture No acute issues suspected Routine pain control  Sinus bradycardia Likely secondary to metoprolol  Will hold metoprolol  tonight  Hypertension BP control Continue home meds  COPD (chronic obstructive pulmonary disease) (HCC) Not acutely exacerbated Continue home inhalers and albuterol  as needed     DVT prophylaxis: Lovenox  Consults: none  Advance Care Planning:   Code Status: Prior   Family Communication: none  Disposition Plan: Back to previous home environment  Severity of Illness: The appropriate patient status for this patient is OBSERVATION. Observation status is judged to be reasonable and necessary in order to provide the required intensity of service to ensure the patient's safety. The patient's presenting symptoms, physical exam findings, and initial radiographic and laboratory data in the context of their medical condition is felt to place them at decreased risk for further clinical deterioration. Furthermore, it is anticipated that the patient will be medically stable for discharge from the hospital within 2 midnights of admission.   Author: Lanetta Pion, MD 11/18/2023 10:44 PM  For on call review [email or web address].

## 2023-11-18 NOTE — ED Notes (Signed)
 Received pt Aox3. Pt resting in bed, currently denies any discomfort. Pt who initially comes in for SOB, noted breathing without work of breathing, no retractions or tripoding. Remains bradycardia on ECG. Pending scan results and further dispo. Safety maintained, family at bedside. All pt specific care ongoing.

## 2023-11-18 NOTE — ED Triage Notes (Signed)
 BIB [facility] from home for sob progressively worse over last week. LS CTA. Pt alert, NAD, calm, interactive, speaking in clear complete sentences, VSS. 98% 3L, HR 60 NSR/SB. RR 24. Denies cough, fever or pain. NSL L AC, 20g. CBG 162. Recent fall PTA with R leg lateral calf bruise. Denies LOC or hitting head. Endorses blood thinner for afib, but not sure which. EDP present on arrival at [facility]. Anterior neck surgery at [facility] recently, incision unremarkable. Bruising noted to neck.

## 2023-11-18 NOTE — Hospital Course (Signed)
 HTN, COPD, prior CVA PAF not on DOAC secondary to history of GI bleed left carotid artery stenosis s/p transcarotid stenting 11/16/23 on DAPT, who presents to the ED with a 1 week history of shortness of breath, left-sided chest pain under her breast and some swelling in her lower extremities.  She denies cough fever or chills.  States that the symptoms started before her procedure at [facility]..  Of note in reviewing her records, patient was seen at the ED at [facility] on 10/05/2023 with left chest wall pain in the same location worse with deep inspiration reporting that it started following a fall 5 days prior.  She underwent a trauma pan scan that showed T4 T6 and L1 compression fractures at different stages.  The incidental 75% left carotid artery stenosis was seen at that time that prompted the referral for vascular. ED course and data review: Bradycardic to the high 40s to low 50s, tachypneic to the low to mid 20s, SBP mostly in the 130s to 140s and O2 sat 98% on room air. Labs notable for troponin 17 and BNP 845 and D-dimer 1.84 WBC 7.4 with Pro-Calc less than 0.1 Respiratory viral panel pending Hemoglobin 10.1 about the same as 10.7 on 4/25, but down from 12.6 in March. EKG, personally viewed and interpreted showing sinus bradycardia at 50 with multiple PVCs Chest x-ray showing patchy bilateral lower lobe opacities, atelectasis versus pneumonia and low lung volumes

## 2023-11-18 NOTE — Assessment & Plan Note (Addendum)
 Continue DAPT No acute issues suspected

## 2023-11-18 NOTE — Assessment & Plan Note (Addendum)
 Suspecting noncardiac chest pain Having left-sided chest pain for which she was seen in [facility] ED with workup showing multiple compression vertebral fractures at different stages Had echo November 2024 with [facility] system to evaluate for anginal symptoms Troponins and EKG not in keeping with ACS Pain control Consider cardiology consult

## 2023-11-18 NOTE — Assessment & Plan Note (Signed)
 Not acutely exacerbated Continue home inhalers and albuterol  as needed

## 2023-11-18 NOTE — Assessment & Plan Note (Signed)
 BP control Continue home meds

## 2023-11-18 NOTE — ED Notes (Addendum)
 Xray finished at [facility], [place], neb initiated

## 2023-11-18 NOTE — ED Notes (Addendum)
 EDP into see, at [facility] Family x2 at [facility].

## 2023-11-19 ENCOUNTER — Observation Stay (HOSPITAL_BASED_OUTPATIENT_CLINIC_OR_DEPARTMENT_OTHER)
Admit: 2023-11-19 | Discharge: 2023-11-19 | Disposition: A | Discharge status: Home / Self Care | Attending: Internal Medicine

## 2023-11-19 DIAGNOSIS — R0600 Dyspnea, unspecified: Secondary | ICD-10-CM | POA: Diagnosis not present

## 2023-11-19 DIAGNOSIS — I5031 Acute diastolic (congestive) heart failure: Secondary | ICD-10-CM | POA: Diagnosis not present

## 2023-11-19 LAB — ECHOCARDIOGRAM COMPLETE
AR max vel: 2.25 cm2
AV Area VTI: 2.16 cm2
AV Area mean vel: 2.17 cm2
AV Mean grad: 3 mmHg
AV Peak grad: 5.8 mmHg
Ao pk vel: 1.21 m/s
Area-P 1/2: 3.83 cm2
Height: 62 in
S' Lateral: 2.7 cm
Weight: 2292.78 [oz_av]

## 2023-11-19 LAB — CBC
HCT: 34 % — ABNORMAL LOW (ref 36.0–46.0)
Hemoglobin: 11.6 g/dL — ABNORMAL LOW (ref 12.0–15.0)
MCH: 34 pg (ref 26.0–34.0)
MCHC: 34.1 g/dL (ref 30.0–36.0)
MCV: 99.7 fL (ref 80.0–100.0)
Platelets: 161 10*3/uL (ref 150–400)
RBC: 3.41 MIL/uL — ABNORMAL LOW (ref 3.87–5.11)
RDW: 13.9 % (ref 11.5–15.5)
WBC: 9.7 10*3/uL (ref 4.0–10.5)
nRBC: 0 % (ref 0.0–0.2)

## 2023-11-19 LAB — BASIC METABOLIC PANEL WITH GFR
Anion gap: 10 (ref 5–15)
BUN: 17 mg/dL (ref 8–23)
CO2: 30 mmol/L (ref 22–32)
Calcium: 9.5 mg/dL (ref 8.9–10.3)
Chloride: 104 mmol/L (ref 98–111)
Creatinine, Ser: 0.75 mg/dL (ref 0.44–1.00)
GFR, Estimated: >60 mL/min (ref >60)
Glucose, Bld: 96 mg/dL (ref 70–99)
Potassium: 3.4 mmol/L — ABNORMAL LOW (ref 3.5–5.1)
Sodium: 144 mmol/L (ref 135–145)

## 2023-11-19 MED ORDER — BUDESON-GLYCOPYRROL-FORMOTEROL 160-9-4.8 MCG/ACT IN AERO
2.0000 | INHALATION_SPRAY | Freq: Two times a day (BID) | RESPIRATORY_TRACT | Status: DC
  Administered 2023-11-19 – 2023-11-20 (×3): 2 via RESPIRATORY_TRACT
  Filled 2023-11-19: qty 5.9

## 2023-11-19 MED ORDER — ASPIRIN 81 MG PO TBEC
81.0000 mg | DELAYED_RELEASE_TABLET | Freq: Every day | ORAL | Status: DC
Start: 2023-11-19 — End: 2023-11-20
  Administered 2023-11-19 – 2023-11-20 (×2): 81 mg via ORAL
  Filled 2023-11-19 (×2): qty 1

## 2023-11-19 MED ORDER — SENNOSIDES-DOCUSATE SODIUM 8.6-50 MG PO TABS
2.0000 | ORAL_TABLET | Freq: Every morning | ORAL | Status: DC
  Administered 2023-11-19 – 2023-11-20 (×2): 2 via ORAL
  Filled 2023-11-19 (×2): qty 2

## 2023-11-19 MED ORDER — POTASSIUM CHLORIDE CRYS ER 10 MEQ PO TBCR
10.0000 meq | EXTENDED_RELEASE_TABLET | Freq: Every day | ORAL | Status: DC
  Administered 2023-11-20: 10 meq via ORAL
  Filled 2023-11-19: qty 1

## 2023-11-19 MED ORDER — CLOPIDOGREL BISULFATE 75 MG PO TABS
75.0000 mg | ORAL_TABLET | Freq: Every day | ORAL | Status: DC
  Administered 2023-11-19 – 2023-11-20 (×2): 75 mg via ORAL
  Filled 2023-11-19 (×2): qty 1

## 2023-11-19 MED ORDER — SPIRONOLACTONE 25 MG PO TABS
25.0000 mg | ORAL_TABLET | Freq: Every day | ORAL | Status: DC
  Administered 2023-11-19 – 2023-11-20 (×2): 25 mg via ORAL
  Filled 2023-11-19 (×2): qty 1

## 2023-11-19 MED ORDER — POTASSIUM CHLORIDE CRYS ER 20 MEQ PO TBCR
40.0000 meq | EXTENDED_RELEASE_TABLET | Freq: Once | ORAL | Status: AC
  Administered 2023-11-19: 40 meq via ORAL
  Filled 2023-11-19: qty 2

## 2023-11-19 MED ORDER — TRAMADOL HCL 50 MG PO TABS: 50.0000 mg | ORAL_TABLET | Freq: Two times a day (BID) | ORAL | Status: DC | PRN

## 2023-11-19 MED ORDER — GABAPENTIN 300 MG PO CAPS
300.0000 mg | ORAL_CAPSULE | Freq: Three times a day (TID) | ORAL | Status: DC
Start: 2023-11-19 — End: 2023-11-20
  Administered 2023-11-19 – 2023-11-20 (×4): 300 mg via ORAL
  Filled 2023-11-19 (×4): qty 1

## 2023-11-19 MED ORDER — ATORVASTATIN CALCIUM 20 MG PO TABS
20.0000 mg | ORAL_TABLET | Freq: Every day | ORAL | Status: DC
  Administered 2023-11-19 – 2023-11-20 (×2): 20 mg via ORAL
  Filled 2023-11-19 (×2): qty 1

## 2023-11-19 NOTE — Plan of Care (Signed)

## 2023-11-19 NOTE — Progress Notes (Signed)
*  PRELIMINARY RESULTS* Echocardiogram 2D Echocardiogram has been performed.  Alisha Murphy 11/19/2023, 8:56 AM

## 2023-11-19 NOTE — Hospital Course (Addendum)
 18XW with h/o HTN, afib not on AC (GI bleeding), COPD, and L ICA stent on 4/25 [facility]) who presented on 4/27 with progressive SOB.  Prior fall in March with thoracic and lumbar compression fractures.  Concern for new onset CHF but normal echocardiogram.  Improved after Lasix , on room air, stable for discharge.

## 2023-11-19 NOTE — Discharge Summary (Addendum)
 Physician Discharge Summary   Patient: Alisha Murphy MRN: [id number] DOB: [birth year]  Admit date:     11/18/2023  Discharge date: 11/19/23  Discharge Physician: Lorita Rosa   PCP: Dewitt Forehand, MD   Recommendations at discharge:   Echocardiogram was normal; cardiology follow up does not appear to be needed at this time Hold metoprolol  given bradycardia (low heart rate) Follow up with vascular surgery on May 20 as scheduled Follow up with Dr. Nedra Ball later this week for recheck  Discharge Diagnoses: Principal Problem:   Acute dyspnea Active Problems:   Left-sided chest pain, recurrent   PAF (paroxysmal atrial fibrillation) (HCC)   S/p internal carotid stent placement 11/16/23   History of ischemic stroke   History of thoracic and lumbar vertebral compression fracture   COPD (chronic obstructive pulmonary disease) (HCC)   Hypertension   Sinus bradycardia    Hospital Course: 88yo with h/o HTN, afib not on AC (GI bleeding), COPD, and L ICA stent on 4/25 [facility]) who presented on 4/27 with progressive SOB.  Prior fall in March with thoracic and lumbar compression fractures.  Concern for new onset CHF but normal echocardiogram.  Improved after Lasix , on room air, stable for discharge.   Addendum:  When patent attempted to get OOB she again developed SOB.  Negative CTA and echo, as noted.  She did ambulatory pulse ox with normal resting O2 levels and O2 to 84% with ambulation.  Suspect chronic hypoxic respiratory failure, possibly exacerbated by atelectasis at the time of recent procedure.  She has been noncompliant with Trelegy, as well.  Will add back daily Trelegy, provide home O2, and monitor for an additional night.  Patient and family are in agreement.  Son does voice some concern about MCI and sundowning; family encouraged to stay with family if possible.    Assessment and Plan:  Acute dyspnea BNP elevated to the 800s CXR with low lung volumes, ?atelectasis CTA chest  negative for acute PE or pneumonia ACS unlikely given negative troponins and nonacute EKG Given IV Lasix  with resolution of symptoms Echocardiogram is normal with regards to both systolic and diastolic function and normal pulmonary pressure Possibly with volume overload related to last week's surgery and/or chronic afib, but now resolved and stable for discharge to home Continue spironolactone  Left-sided chest pain, recurrent Suspecting noncardiac chest pain Having left-sided chest pain for which she was seen in [facility] with workup showing multiple compression vertebral fractures at different stages Had echo November 2024 with [facility] system to evaluate for anginal symptoms Troponins and EKG not in keeping with ACS Reports no issues today and wants to go home   S/p internal carotid stent placement 11/16/23 Currently on DAPT, Lipitor May be related to pain/SOB on presentation but appears to be stable at this time Outpatient f/u with surgery as scheduled   PAF (paroxysmal atrial fibrillation)  Continue aspirin    History of ischemic stroke Continue DAPT No acute issues suspected   History of thoracic and lumbar vertebral compression fracture No acute issues suspected Routine pain control   Sinus bradycardia Likely secondary to metoprolol  Continue to hold metoprolol  for now    Hypertension BP control Continue spironolactone Hold metoprolol  due to bradycardia   COPD (chronic obstructive pulmonary disease)  Not acutely exacerbated Continue Trelegy and albuterol  as needed         Consultants: None   Procedures: Echocardiogram 4/28   Antibiotics: Ceftriaxone  x 1 Azithromycin x 1  Pain control - [place]  Controlled Substance  Reporting System database was reviewed. and patient was instructed, not to drive, operate heavy machinery, perform activities at heights, swimming or participation in water activities or provide baby-sitting services while on Pain, Sleep and  Anxiety Medications; until their outpatient Physician has advised to do so again. Also recommended to not to take more than prescribed Pain, Sleep and Anxiety Medications.    Disposition: Home Diet recommendation:  Cardiac diet DISCHARGE MEDICATION: Allergies as of 11/19/2023       Reactions   Doxycycline Rash        Medication List     PAUSE taking these medications    metoprolol  succinate 100 MG 24 hr tablet Wait to take this until your doctor or other care provider tells you to start again. Commonly known as: TOPROL -XL Take 100 mg by mouth in the morning.       TAKE these medications    albuterol  108 (90 Base) MCG/ACT inhaler Commonly known as: VENTOLIN  HFA Inhale 1-2 puffs into the lungs every 6 (six) hours as needed for wheezing or shortness of breath.   Aspirin  81 81 MG tablet Generic drug: aspirin  EC Take 81 mg by mouth daily.   atorvastatin 20 MG tablet Commonly known as: LIPITOR Take 20 mg by mouth daily.   CALTRATE 600+D PO Take 1 tablet by mouth daily.   clopidogrel 75 MG tablet Commonly known as: PLAVIX Take 1 tablet by mouth daily.   gabapentin  300 MG capsule Commonly known as: NEURONTIN  Take 300 mg by mouth 3 (three) times daily.   potassium chloride  10 MEQ tablet Commonly known as: KLOR-CON  M Take 10 mEq by mouth in the morning.   senna-docusate 8.6-50 MG tablet Commonly known as: Senokot-S Take 2 tablets by mouth in the morning.   spironolactone 25 MG tablet Commonly known as: ALDACTONE Take 1 tablet by mouth daily.   traMADol  50 MG tablet Commonly known as: ULTRAM  Take 1 tablet (50 mg total) by mouth every 12 (twelve) hours as needed. What changed: reasons to take this   Trelegy Ellipta 100-62.5-25 MCG/ACT Aepb Generic drug: Fluticasone -Umeclidin-Vilant Inhale 1 puff into the lungs daily as needed.        Discharge Exam:    Subjective: Feeling better today and wants to go home.  Not SOB, on RA, no chest  discomfort.   Objective: Vitals:   11/19/23 0440 11/19/23 0954  BP: (!) 154/56 (!) 130/59  Pulse: 84 (!) 55  Resp: 18   Temp: 97.9 F (36.6 C) (!) 97.5 F (36.4 C)  SpO2: 96% 95%    Intake/Output Summary (Last 24 hours) at 11/19/2023 1200 Last data filed at 11/19/2023 1000 Gross per 24 hour  Intake 0 ml  Output 1621 ml  Net -1621 ml   Filed Weights   11/18/23 1706 11/19/23 0500  Weight: 64.4 kg 65 kg    Exam:  General:  Appears calm and comfortable and is in NAD Eyes:  EOMI, normal lids, iris ENT:  grossly normal hearing, lips & tongue, mmm Neck:  recent L carotid surgery with healing ecchymosis along anterior neck Cardiovascular:  RRR. No LE edema.  Respiratory:   CTA bilaterally with no wheezes/rales/rhonchi.  Normal respiratory effort. Abdomen:  soft, NT, ND Skin:  diffuse bruising Musculoskeletal:  grossly normal tone BUE/BLE, good ROM, no bony abnormality Psychiatric:  grossly normal mood and affect, speech fluent and appropriate, AOx3 Neurologic:  CN 2-12 grossly intact, moves all extremities in coordinated fashion  Data Reviewed: I have reviewed the patient's lab results  since admission.  Pertinent labs for today include:   K+ 3.4 WBC 9.7 Hgb 11.6, stable     Condition at discharge: stable  The results of significant diagnostics from this hospitalization (including imaging, microbiology, ancillary and laboratory) are listed below for reference.   Imaging Studies: ECHOCARDIOGRAM COMPLETE Result Date: 11/19/2023    ECHOCARDIOGRAM REPORT   Patient Name:   Alisha Murphy Macdonell Date of Exam: 11/19/2023 Medical Rec #:  [id number]      Height:       62.0 in Accession #:    [id number]     Weight:       143.3 lb Date of Birth:  [birth year]     BSA:          1.659 m Patient Age:    88 years       BP:           154/84 mmHg Patient Gender: F              HR:           84 bpm. Exam Location:  [facility] Procedure: 2D Echo, Cardiac Doppler and Color Doppler (Both Spectral and Color             Flow Doppler were utilized during procedure). Indications:     CHF-acute diastolic I50.31  History:         Patient has no prior history of Echocardiogram examinations.                  COPD and Stroke; Arrythmias:Atrial Fibrillation.  Sonographer:     Broadus Canes Referring Phys:  [id number] Lanetta Pion Diagnosing Phys: Antionette Kirks MD  Sonographer Comments: Image acquisition challenging due to COPD. IMPRESSIONS  1. Left ventricular ejection fraction, by estimation, is 60 to 65%. The left ventricle has normal function. The left ventricle has no regional wall motion abnormalities. There is mild left ventricular hypertrophy. Left ventricular diastolic parameters were normal.  2. Right ventricular systolic function is normal. The right ventricular size is normal. There is normal pulmonary artery systolic pressure. The estimated right ventricular systolic pressure is 31.4 mmHg.  3. Left atrial size was mildly dilated.  4. The mitral valve is normal in structure. Mild mitral valve regurgitation. No evidence of mitral stenosis.  5. The aortic valve is normal in structure. Aortic valve regurgitation is not visualized. Aortic valve sclerosis/calcification is present, without any evidence of aortic stenosis. FINDINGS  Left Ventricle: Left ventricular ejection fraction, by estimation, is 60 to 65%. The left ventricle has normal function. The left ventricle has no regional wall motion abnormalities. The left ventricular internal cavity size was normal in size. There is  mild left ventricular hypertrophy. Left ventricular diastolic parameters were normal. Right Ventricle: The right ventricular size is normal. No increase in right ventricular wall thickness. Right ventricular systolic function is normal. There is normal pulmonary artery systolic pressure. The tricuspid regurgitant velocity is 2.57 m/s, and  with an assumed right atrial pressure of 5 mmHg, the estimated right ventricular systolic pressure is 31.4 mmHg.  Left Atrium: Left atrial size was mildly dilated. Right Atrium: Right atrial size was normal in size. Pericardium: There is no evidence of pericardial effusion. Mitral Valve: The mitral valve is normal in structure. Mild mitral annular calcification. Mild mitral valve regurgitation. No evidence of mitral valve stenosis. Tricuspid Valve: The tricuspid valve is normal in structure. Tricuspid valve regurgitation is not demonstrated. No evidence of tricuspid stenosis. Aortic Valve: The aortic valve  is normal in structure. Aortic valve regurgitation is not visualized. Aortic valve sclerosis/calcification is present, without any evidence of aortic stenosis. Aortic valve mean gradient measures 3.0 mmHg. Aortic valve peak  gradient measures 5.8 mmHg. Aortic valve area, by VTI measures 2.16 cm. Pulmonic Valve: The pulmonic valve was normal in structure. Pulmonic valve regurgitation is not visualized. No evidence of pulmonic stenosis. Aorta: The aortic root is normal in size and structure. Venous: The inferior vena cava was not well visualized. IAS/Shunts: No atrial level shunt detected by color flow Doppler.  LEFT VENTRICLE PLAX 2D LVIDd:         4.50 cm   Diastology LVIDs:         2.70 cm   LV e' medial:    5.98 cm/s LV PW:         1.00 cm   LV E/e' medial:  13.5 LV IVS:        1.40 cm   LV e' lateral:   10.60 cm/s LVOT diam:     2.00 cm   LV E/e' lateral: 7.6 LV SV:         56 LV SV Index:   34 LVOT Area:     3.14 cm  RIGHT VENTRICLE RV Basal diam:  3.60 cm RV Mid diam:    2.80 cm RV S prime:     15.70 cm/s TAPSE (M-mode): 2.4 cm LEFT ATRIUM           Index        RIGHT ATRIUM           Index LA diam:      4.40 cm 2.65 cm/m   RA Area:     12.00 cm LA Vol (A2C): 35.7 ml 21.52 ml/m  RA Volume:   23.20 ml  13.98 ml/m LA Vol (A4C): 42.0 ml 25.31 ml/m  AORTIC VALVE AV Area (Vmax):    2.25 cm AV Area (Vmean):   2.17 cm AV Area (VTI):     2.16 cm AV Vmax:           120.50 cm/s AV Vmean:          74.500 cm/s AV VTI:             0.260 m AV Peak Grad:      5.8 mmHg AV Mean Grad:      3.0 mmHg LVOT Vmax:         86.20 cm/s LVOT Vmean:        51.500 cm/s LVOT VTI:          0.179 m LVOT/AV VTI ratio: 0.69  AORTA Ao Root diam: 3.00 cm MITRAL VALVE               TRICUSPID VALVE MV Area (PHT): 3.83 cm    TR Peak grad:   26.4 mmHg MV Decel Time: 198 msec    TR Vmax:        257.00 cm/s MV E velocity: 80.70 cm/s MV A velocity: 36.70 cm/s  SHUNTS MV E/A ratio:  2.20        Systemic VTI:  0.18 m                            Systemic Diam: 2.00 cm Antionette Kirks MD Electronically signed by Antionette Kirks MD Signature Date/Time: 11/19/2023/11:28:01 AM    Final    CT Angio Chest PE W/Cm &/Or Wo Cm Result Date: 11/18/2023 EXAM:  CTA CHEST PE 11/18/2023 10:27:45 PM TECHNIQUE: CTA of the chest was performed without and with intravenous contrast (75mL iohexol (OMNIPAQUE) 350 MG/ML injection). Multiplanar reformatted images are provided for review. MIP images are provided for review. Automated exposure control, iterative reconstruction, and/or weight based adjustment of the mA/kV was utilized to reduce the radiation dose to as low as reasonably achievable. COMPARISON: Chest radiograph earlier today and CT chest 05/13/2023. CLINICAL HISTORY: Pulmonary embolism (PE) suspected, low to intermediate probability, positive D-dimer. Patient presents with shortness of breath progressively worse over the last week. Alert, no acute distress, calm, interactive, speaking in clear complete sentences, vital signs stable. Denies cough, fever, or pain. Recent fall with right leg lateral calf bruise. Denies loss of consciousness or head trauma. On blood thinner for atrial fibrillation. Recent anterior neck surgery with unremarkable incision and noted bruising to neck. FINDINGS: PULMONARY ARTERIES: Pulmonary arteries are adequately opacified for evaluation. No evidence of pulmonary embolism. Main pulmonary artery is normal in caliber. MEDIASTINUM: No evidence of  mediastinal lymphadenopathy. The heart and pericardium demonstrate no acute abnormality. Atherosclerotic calcifications of the aortic arch. Mild 3-vessel coronary atherosclerosis. LYMPH NODES: No evidence of mediastinal, hilar or axillary lymphadenopathy. LUNGS AND PLEURA: Scattered mild ground-glass opacity/mosaic attenuation (image 23), favoring scattered atelectasis. No evidence of pleural effusion or pneumothorax. Eventration of the right hemidiaphragm. UPPER ABDOMEN: Limited images of the upper abdomen are unremarkable. SOFT TISSUES AND BONES: Mild superior endplate compression fracture deformities at T4 and T6. Moderate superoinferior compression fracture deformity at L1, chronic. No acute soft tissue abnormality. IMPRESSION: 1. No evidence of pulmonary embolism. 2. Eventration of the right hemidiaphragm with scattered atelectasis. Electronically signed by: Zadie Herter MD 11/18/2023 10:34 PM EDT RP Workstation: [id number]   DG Chest Portable 1 View Result Date: 11/18/2023 EXAM: 1 VIEW(S) XRAY OF THE CHEST 11/18/2023 05:09:00 PM COMPARISON: CT chest 05/13/2023 CLINICAL HISTORY: SOB. Increased shortness of breath. FINDINGS: LUNGS AND PLEURA: Low lung volumes with patchy bilateral lower lobe opacities, atelectasis versus pneumonia. HEART AND MEDIASTINUM: No acute abnormality of the cardiac and mediastinal silhouettes. BONES AND SOFT TISSUES: No acute osseous abnormality. DIAPHRAGM: Eventration of the right hemidiaphragm. IMPRESSION: 1. Low lung volumes. Patchy bilateral lower lobe opacities, atelectasis versus pneumonia. 2. Eventration of the right hemidiaphragm. Electronically signed by: Zadie Herter MD 11/18/2023 07:23 PM EDT RP Workstation: [id number]    Microbiology: Results for orders placed or performed during the hospital encounter of 11/18/23  Resp panel by RT-PCR (RSV, Flu A&B, Covid) Anterior Nasal Swab     Status: None   Collection Time: 11/18/23  8:38 PM   Specimen: Anterior Nasal  Swab  Result Value Ref Range Status   SARS Coronavirus 2 by RT PCR NEGATIVE NEGATIVE Final    Comment: (NOTE) SARS-CoV-2 target nucleic acids are NOT DETECTED.  The SARS-CoV-2 RNA is generally detectable in upper respiratory specimens during the acute phase of infection. The lowest concentration of SARS-CoV-2 viral copies this assay can detect is 138 copies/mL. A negative result does not preclude SARS-Cov-2 infection and should not be used as the sole basis for treatment or other patient management decisions. A negative result may occur with  improper specimen collection/handling, submission of specimen other than nasopharyngeal swab, presence of viral mutation(s) within the areas targeted by this assay, and inadequate number of viral copies(<138 copies/mL). A negative result must be combined with clinical observations, patient history, and epidemiological information. The expected result is Negative.  Fact Sheet for Patients:  BloggerCourse.com  Fact Sheet for Healthcare Providers:  SeriousBroker.it  This test is no t yet approved or cleared by the [place]  FDA and  has been authorized for detection and/or diagnosis of SARS-CoV-2 by FDA under an Emergency Use Authorization (EUA). This EUA will remain  in effect (meaning this test can be used) for the duration of the COVID-19 declaration under Section 564(b)(1) of the Act, 21 U.S.C.section 360bbb-3(b)(1), unless the authorization is terminated  or revoked sooner.       Influenza A by PCR NEGATIVE NEGATIVE Final   Influenza B by PCR NEGATIVE NEGATIVE Final    Comment: (NOTE) The Xpert Xpress SARS-CoV-2/FLU/RSV plus assay is intended as an aid in the diagnosis of influenza from Nasopharyngeal swab specimens and should not be used as a sole basis for treatment. Nasal washings and aspirates are unacceptable for Xpert Xpress SARS-CoV-2/FLU/RSV testing.  Fact Sheet for  Patients: BloggerCourse.com  Fact Sheet for Healthcare Providers: SeriousBroker.it  This test is not yet approved or cleared by the [place]  FDA and has been authorized for detection and/or diagnosis of SARS-CoV-2 by FDA under an Emergency Use Authorization (EUA). This EUA will remain in effect (meaning this test can be used) for the duration of the COVID-19 declaration under Section 564(b)(1) of the Act, 21 U.S.C. section 360bbb-3(b)(1), unless the authorization is terminated or revoked.     Resp Syncytial Virus by PCR NEGATIVE NEGATIVE Final    Comment: (NOTE) Fact Sheet for Patients: BloggerCourse.com  Fact Sheet for Healthcare Providers: SeriousBroker.it  This test is not yet approved or cleared by the [place]  FDA and has been authorized for detection and/or diagnosis of SARS-CoV-2 by FDA under an Emergency Use Authorization (EUA). This EUA will remain in effect (meaning this test can be used) for the duration of the COVID-19 declaration under Section 564(b)(1) of the Act, 21 U.S.C. section 360bbb-3(b)(1), unless the authorization is terminated or revoked.  Performed at [facility], [address] [place], [place] [address]     Labs: CBC: Recent Labs  Lab 11/18/23 1659 11/19/23 0551  WBC 7.4 9.7  NEUTROABS 5.1  --   HGB 10.1* 11.6*  HCT 31.0* 34.0*  MCV 105.4* 99.7  PLT 140* 161   Basic Metabolic Panel: Recent Labs  Lab 11/18/23 1659 11/19/23 0551  NA 142 144  K 3.8 3.4*  CL 105 104  CO2 31 30  GLUCOSE 161* 96  BUN 19 17  CREATININE 0.87 0.75  CALCIUM 9.2 9.5  MG 1.8  --    Liver Function Tests: No results for input(s): "AST", "ALT", "ALKPHOS", "BILITOT", "PROT", "ALBUMIN" in the last 168 hours. CBG: No results for input(s): "GLUCAP" in the last 168 hours.  Discharge time spent: greater than 30 minutes.  Signed: Lorita Rosa, MD [facility] 11/19/2023

## 2023-11-19 NOTE — Progress Notes (Signed)
 SATURATION QUALIFICATIONS: (This note is used to comply with regulatory documentation for home oxygen)  Patient Saturations on Room Air at Rest = 99%  Patient Saturations on Room Air while Ambulating = 84%  Patient Saturations on 2 Liters of oxygen while Ambulating = 93%  Please briefly explain why patient needs home oxygen:  Patient had to rest 3-4 time to make a lapse around the nurses station, after walking patient complained of dizziness and eyes being fuzzy, vitals stable, BP 148/47(80).

## 2023-11-20 DIAGNOSIS — J449 Chronic obstructive pulmonary disease, unspecified: Secondary | ICD-10-CM | POA: Diagnosis not present

## 2023-11-20 DIAGNOSIS — R0600 Dyspnea, unspecified: Secondary | ICD-10-CM | POA: Diagnosis not present

## 2023-11-20 LAB — CBC WITH DIFFERENTIAL/PLATELET
Abs Immature Granulocytes: 0.02 10*3/uL (ref 0.00–0.07)
Basophils Absolute: 0 10*3/uL (ref 0.0–0.1)
Basophils Relative: 1 %
Eosinophils Absolute: 0.2 10*3/uL (ref 0.0–0.5)
Eosinophils Relative: 3 %
HCT: 32.5 % — ABNORMAL LOW (ref 36.0–46.0)
Hemoglobin: 10.6 g/dL — ABNORMAL LOW (ref 12.0–15.0)
Immature Granulocytes: 0 %
Lymphocytes Relative: 32 %
Lymphs Abs: 2.3 10*3/uL (ref 0.7–4.0)
MCH: 33.2 pg (ref 26.0–34.0)
MCHC: 32.6 g/dL (ref 30.0–36.0)
MCV: 101.9 fL — ABNORMAL HIGH (ref 80.0–100.0)
Monocytes Absolute: 0.6 10*3/uL (ref 0.1–1.0)
Monocytes Relative: 8 %
Neutro Abs: 4.1 10*3/uL (ref 1.7–7.7)
Neutrophils Relative %: 56 %
Platelets: 158 10*3/uL (ref 150–400)
RBC: 3.19 MIL/uL — ABNORMAL LOW (ref 3.87–5.11)
RDW: 14.2 % (ref 11.5–15.5)
WBC: 7.3 10*3/uL (ref 4.0–10.5)
nRBC: 0 % (ref 0.0–0.2)

## 2023-11-20 LAB — BASIC METABOLIC PANEL WITH GFR
Anion gap: 11 (ref 5–15)
BUN: 22 mg/dL (ref 8–23)
CO2: 30 mmol/L (ref 22–32)
Calcium: 9.1 mg/dL (ref 8.9–10.3)
Chloride: 98 mmol/L (ref 98–111)
Creatinine, Ser: 0.95 mg/dL (ref 0.44–1.00)
GFR, Estimated: 58 mL/min — ABNORMAL LOW (ref >60)
Glucose, Bld: 103 mg/dL — ABNORMAL HIGH (ref 70–99)
Potassium: 3.7 mmol/L (ref 3.5–5.1)
Sodium: 139 mmol/L (ref 135–145)

## 2023-11-20 NOTE — Care Management Obs Status (Signed)
 MEDICARE OBSERVATION STATUS NOTIFICATION   Patient Details  Name: Alisha Murphy MRN: [id number] Date of Birth: 04-08-1935   Medicare Observation Status Notification Given:       Anise Kerns 11/20/2023, 11:56 AM

## 2023-11-20 NOTE — Plan of Care (Signed)

## 2023-11-20 NOTE — Progress Notes (Signed)
 Discharge instructions given to patient and daughter, all questions answered. Waiting for oxygen delivery and then may go home per orders.

## 2023-11-20 NOTE — Care Management Obs Status (Signed)
 MEDICARE OBSERVATION STATUS NOTIFICATION   Patient Details  Name: Alisha Murphy MRN: [id number] Date of Birth: [birth year]   Medicare Observation Status Notification Given:  Yes    Anise Kerns 11/20/2023, 11:57 AM

## 2023-11-20 NOTE — Discharge Summary (Signed)
 Physician Discharge Summary   Patient: Alisha Murphy MRN: [id number] DOB: 12-14-1934  Admit date:     11/18/2023  Discharge date: 11/20/23  Discharge Physician: Lorita Rosa   PCP: Dewitt Forehand, MD   Recommendations at discharge:   You are being discharged with home oxygen Echocardiogram was normal; cardiology follow up does not appear to be needed at this time Hold metoprolol  given bradycardia (low heart rate) Follow up with vascular surgery on May 20 as scheduled Follow up with Dr. Nedra Ball later this week for recheck  Discharge Diagnoses: Principal Problem:   Acute dyspnea Active Problems:   Left-sided chest pain, recurrent   PAF (paroxysmal atrial fibrillation) (HCC)   S/p internal carotid stent placement 11/16/23   History of ischemic stroke   History of thoracic and lumbar vertebral compression fracture   COPD (chronic obstructive pulmonary disease) (HCC)   Hypertension   Sinus bradycardia    Hospital Course: 88yo with h/o HTN, afib not on AC (GI bleeding), COPD, and L ICA stent on 4/25 [facility]) who presented on 4/27 with progressive SOB.  Prior fall in March with thoracic and lumbar compression fractures.  Concern for new onset CHF but normal echocardiogram.  Improved after Lasix , on room air, stable for discharge.  Assessment and Plan:  Acute dyspnea BNP elevated to the 800s CXR with low lung volumes, ?atelectasis CTA chest negative for acute PE or pneumonia ACS unlikely given negative troponins and nonacute EKG Given IV Lasix  with resolution of symptoms Echocardiogram is normal with regards to both systolic and diastolic function and normal pulmonary pressure Possibly with volume overload related to last week's surgery and/or chronic afib, but now resolved and stable for discharge to home Continue spironolactone   Left-sided chest pain, recurrent Suspecting noncardiac chest pain Having left-sided chest pain for which she was seen in [facility] ED with workup showing  multiple compression vertebral fractures at different stages Had echo November 2024 with [facility] system to evaluate for anginal symptoms Troponins and EKG not in keeping with ACS Reports no issues today and wants to go home   S/p internal carotid stent placement 11/16/23 Currently on DAPT, Lipitor May be related to pain/SOB on presentation but appears to be stable at this time Outpatient f/u with surgery as scheduled   PAF (paroxysmal atrial fibrillation)  Continue aspirin    History of ischemic stroke Continue DAPT No acute issues suspected   History of thoracic and lumbar vertebral compression fracture No acute issues suspected Routine pain control   Sinus bradycardia Likely secondary to metoprolol  Continue to hold metoprolol  for now   Hypertension BP control Continue spironolactone Hold metoprolol  due to bradycardia   COPD (chronic obstructive pulmonary disease)  Not acutely exacerbated Continue Trelegy and albuterol  as needed Appears to now require home O2         Consultants: None   Procedures: Echocardiogram 4/28   Antibiotics: Ceftriaxone  x 1 Azithromycin x 1    Pain control - [place]  Controlled Substance Reporting System database was reviewed. and patient was instructed, not to drive, operate heavy machinery, perform activities at heights, swimming or participation in water activities or provide baby-sitting services while on Pain, Sleep and Anxiety Medications; until their outpatient Physician has advised to do so again. Also recommended to not to take more than prescribed Pain, Sleep and Anxiety Medications.   Disposition: Home Diet recommendation:  Discharge Diet Orders (From admission, onward)     Start     Ordered   11/19/23 0000  Diet -  low sodium heart healthy        11/19/23 1200           Cardiac diet DISCHARGE MEDICATION: Allergies as of 11/20/2023       Reactions   Doxycycline Rash        Medication List     PAUSE taking  these medications    metoprolol  succinate 100 MG 24 hr tablet Wait to take this until your doctor or other care provider tells you to start again. Commonly known as: TOPROL -XL Take 100 mg by mouth in the morning.       TAKE these medications    albuterol  108 (90 Base) MCG/ACT inhaler Commonly known as: VENTOLIN  HFA Inhale 1-2 puffs into the lungs every 6 (six) hours as needed for wheezing or shortness of breath.   Aspirin  81 81 MG tablet Generic drug: aspirin  EC Take 81 mg by mouth daily.   atorvastatin 20 MG tablet Commonly known as: LIPITOR Take 20 mg by mouth daily.   CALTRATE 600+D PO Take 1 tablet by mouth daily.   clopidogrel 75 MG tablet Commonly known as: PLAVIX Take 1 tablet by mouth daily.   gabapentin  300 MG capsule Commonly known as: NEURONTIN  Take 300 mg by mouth 3 (three) times daily.   potassium chloride  10 MEQ tablet Commonly known as: KLOR-CON  M Take 10 mEq by mouth in the morning.   senna-docusate 8.6-50 MG tablet Commonly known as: Senokot-S Take 2 tablets by mouth in the morning.   spironolactone 25 MG tablet Commonly known as: ALDACTONE Take 1 tablet by mouth daily.   traMADol  50 MG tablet Commonly known as: ULTRAM  Take 1 tablet (50 mg total) by mouth every 12 (twelve) hours as needed. What changed: reasons to take this   Trelegy Ellipta 100-62.5-25 MCG/ACT Aepb Generic drug: Fluticasone -Umeclidin-Vilant Inhale 1 puff into the lungs daily as needed.               Durable Medical Equipment  (From admission, onward)           Start     Ordered   11/19/23 1607  For home use only DME oxygen  Once       Question Answer Comment  Length of Need Lifetime   Mode or (Route) Nasal cannula   Liters per Minute 2   Frequency Continuous (stationary and portable oxygen unit needed)   Oxygen conserving device Yes   Oxygen delivery system Gas      11/19/23 1607            Discharge Exam:    Subjective: Feeling better  ,sitting up in bedside chair, does not want home therapy, wants to go home now.   Objective: Vitals:   11/20/23 0300 11/20/23 0734  BP: 130/62 (!) 113/44  Pulse: 60 66  Resp: 16 16  Temp: 97.6 F (36.4 C) 98.4 F (36.9 C)  SpO2: 99% 90%    Intake/Output Summary (Last 24 hours) at 11/20/2023 1103 Last data filed at 11/20/2023 1045 Gross per 24 hour  Intake 120 ml  Output --  Net 120 ml   Filed Weights   11/18/23 1706 11/19/23 0500 11/20/23 0500  Weight: 64.4 kg 65 kg 62.9 kg    Exam:  General:  Appears calm and comfortable and is in NAD, currently on RA Eyes:  EOMI, normal lids, iris ENT:  grossly normal hearing, lips & tongue, mmm Neck:  recent L carotid surgery with healing ecchymosis along anterior neck and now extending onto  chest Cardiovascular:  RRR. No LE edema.  Respiratory:   CTA bilaterally with no wheezes/rales/rhonchi.  Normal respiratory effort. Abdomen:  soft, NT, ND Skin:  diffuse bruising Musculoskeletal:  grossly normal tone BUE/BLE, good ROM, no bony abnormality Psychiatric:  grossly normal mood and affect, speech fluent and appropriate, AOx3 Neurologic:  CN 2-12 grossly intact, moves all extremities in coordinated fashion-12 grossly intact, moves all extremities in coordinated fashion, sensation intact  Data Reviewed: I have reviewed the patient's lab results since admission.  Pertinent labs for today include:  Stable BMP Stable CBC     Condition at discharge: stable  The results of significant diagnostics from this hospitalization (including imaging, microbiology, ancillary and laboratory) are listed below for reference.   Imaging Studies: ECHOCARDIOGRAM COMPLETE Result Date: 11/19/2023    ECHOCARDIOGRAM REPORT   Patient Name:   Alisha Murphy Date of Exam: 11/19/2023 Medical Rec #:  [id number]      Height:       62.0 in Accession #:    [id number]     Weight:       143.3 lb Date of Birth:  06-Oct-1934     BSA:          1.659 m Patient Age:    88  years       BP:           154/84 mmHg Patient Gender: F              HR:           84 bpm. Exam Location:  [facility] Procedure: 2D Echo, Cardiac Doppler and Color Doppler (Both Spectral and Color            Flow Doppler were utilized during procedure). Indications:     CHF-acute diastolic I50.31  History:         Patient has no prior history of Echocardiogram examinations.                  COPD and Stroke; Arrythmias:Atrial Fibrillation.  Sonographer:     Broadus Canes Referring Phys:  [id number] Lanetta Pion Diagnosing Phys: Antionette Kirks MD  Sonographer Comments: Image acquisition challenging due to COPD. IMPRESSIONS  1. Left ventricular ejection fraction, by estimation, is 60 to 65%. The left ventricle has normal function. The left ventricle has no regional wall motion abnormalities. There is mild left ventricular hypertrophy. Left ventricular diastolic parameters were normal.  2. Right ventricular systolic function is normal. The right ventricular size is normal. There is normal pulmonary artery systolic pressure. The estimated right ventricular systolic pressure is 31.4 mmHg.  3. Left atrial size was mildly dilated.  4. The mitral valve is normal in structure. Mild mitral valve regurgitation. No evidence of mitral stenosis.  5. The aortic valve is normal in structure. Aortic valve regurgitation is not visualized. Aortic valve sclerosis/calcification is present, without any evidence of aortic stenosis. FINDINGS  Left Ventricle: Left ventricular ejection fraction, by estimation, is 60 to 65%. The left ventricle has normal function. The left ventricle has no regional wall motion abnormalities. The left ventricular internal cavity size was normal in size. There is  mild left ventricular hypertrophy. Left ventricular diastolic parameters were normal. Right Ventricle: The right ventricular size is normal. No increase in right ventricular wall thickness. Right ventricular systolic function is normal. There is normal pulmonary  artery systolic pressure. The tricuspid regurgitant velocity is 2.57 m/s, and  with an assumed right atrial pressure of 5 mmHg, the estimated  right ventricular systolic pressure is 31.4 mmHg. Left Atrium: Left atrial size was mildly dilated. Right Atrium: Right atrial size was normal in size. Pericardium: There is no evidence of pericardial effusion. Mitral Valve: The mitral valve is normal in structure. Mild mitral annular calcification. Mild mitral valve regurgitation. No evidence of mitral valve stenosis. Tricuspid Valve: The tricuspid valve is normal in structure. Tricuspid valve regurgitation is not demonstrated. No evidence of tricuspid stenosis. Aortic Valve: The aortic valve is normal in structure. Aortic valve regurgitation is not visualized. Aortic valve sclerosis/calcification is present, without any evidence of aortic stenosis. Aortic valve mean gradient measures 3.0 mmHg. Aortic valve peak  gradient measures 5.8 mmHg. Aortic valve area, by VTI measures 2.16 cm. Pulmonic Valve: The pulmonic valve was normal in structure. Pulmonic valve regurgitation is not visualized. No evidence of pulmonic stenosis. Aorta: The aortic root is normal in size and structure. Venous: The inferior vena cava was not well visualized. IAS/Shunts: No atrial level shunt detected by color flow Doppler.  LEFT VENTRICLE PLAX 2D LVIDd:         4.50 cm   Diastology LVIDs:         2.70 cm   LV e' medial:    5.98 cm/s LV PW:         1.00 cm   LV E/e' medial:  13.5 LV IVS:        1.40 cm   LV e' lateral:   10.60 cm/s LVOT diam:     2.00 cm   LV E/e' lateral: 7.6 LV SV:         56 LV SV Index:   34 LVOT Area:     3.14 cm  RIGHT VENTRICLE RV Basal diam:  3.60 cm RV Mid diam:    2.80 cm RV S prime:     15.70 cm/s TAPSE (M-mode): 2.4 cm LEFT ATRIUM           Index        RIGHT ATRIUM           Index LA diam:      4.40 cm 2.65 cm/m   RA Area:     12.00 cm LA Vol (A2C): 35.7 ml 21.52 ml/m  RA Volume:   23.20 ml  13.98 ml/m LA Vol (A4C):  42.0 ml 25.31 ml/m  AORTIC VALVE AV Area (Vmax):    2.25 cm AV Area (Vmean):   2.17 cm AV Area (VTI):     2.16 cm AV Vmax:           120.50 cm/s AV Vmean:          74.500 cm/s AV VTI:            0.260 m AV Peak Grad:      5.8 mmHg AV Mean Grad:      3.0 mmHg LVOT Vmax:         86.20 cm/s LVOT Vmean:        51.500 cm/s LVOT VTI:          0.179 m LVOT/AV VTI ratio: 0.69  AORTA Ao Root diam: 3.00 cm MITRAL VALVE               TRICUSPID VALVE MV Area (PHT): 3.83 cm    TR Peak grad:   26.4 mmHg MV Decel Time: 198 msec    TR Vmax:        257.00 cm/s MV E velocity: 80.70 cm/s MV A velocity: 36.70 cm/s  SHUNTS  MV E/A ratio:  2.20        Systemic VTI:  0.18 m                            Systemic Diam: 2.00 cm Antionette Kirks MD Electronically signed by Antionette Kirks MD Signature Date/Time: 11/19/2023/11:28:01 AM    Final    CT Angio Chest PE W/Cm &/Or Wo Cm Result Date: 11/18/2023 EXAM: CTA CHEST PE 11/18/2023 10:27:45 PM TECHNIQUE: CTA of the chest was performed without and with intravenous contrast (75mL iohexol (OMNIPAQUE) 350 MG/ML injection). Multiplanar reformatted images are provided for review. MIP images are provided for review. Automated exposure control, iterative reconstruction, and/or weight based adjustment of the mA/kV was utilized to reduce the radiation dose to as low as reasonably achievable. COMPARISON: Chest radiograph earlier today and CT chest 05/13/2023. CLINICAL HISTORY: Pulmonary embolism (PE) suspected, low to intermediate probability, positive D-dimer. Patient presents with shortness of breath progressively worse over the last week. Alert, no acute distress, calm, interactive, speaking in clear complete sentences, vital signs stable. Denies cough, fever, or pain. Recent fall with right leg lateral calf bruise. Denies loss of consciousness or head trauma. On blood thinner for atrial fibrillation. Recent anterior neck surgery with unremarkable incision and noted bruising to neck. FINDINGS:  PULMONARY ARTERIES: Pulmonary arteries are adequately opacified for evaluation. No evidence of pulmonary embolism. Main pulmonary artery is normal in caliber. MEDIASTINUM: No evidence of mediastinal lymphadenopathy. The heart and pericardium demonstrate no acute abnormality. Atherosclerotic calcifications of the aortic arch. Mild 3-vessel coronary atherosclerosis. LYMPH NODES: No evidence of mediastinal, hilar or axillary lymphadenopathy. LUNGS AND PLEURA: Scattered mild ground-glass opacity/mosaic attenuation (image 23), favoring scattered atelectasis. No evidence of pleural effusion or pneumothorax. Eventration of the right hemidiaphragm. UPPER ABDOMEN: Limited images of the upper abdomen are unremarkable. SOFT TISSUES AND BONES: Mild superior endplate compression fracture deformities at T4 and T6. Moderate superoinferior compression fracture deformity at L1, chronic. No acute soft tissue abnormality. IMPRESSION: 1. No evidence of pulmonary embolism. 2. Eventration of the right hemidiaphragm with scattered atelectasis. Electronically signed by: Zadie Herter MD 11/18/2023 10:34 PM EDT RP Workstation: [id number]   DG Chest Portable 1 View Result Date: 11/18/2023 EXAM: 1 VIEW(S) XRAY OF THE CHEST 11/18/2023 05:09:00 PM COMPARISON: CT chest 05/13/2023 CLINICAL HISTORY: SOB. Increased shortness of breath. FINDINGS: LUNGS AND PLEURA: Low lung volumes with patchy bilateral lower lobe opacities, atelectasis versus pneumonia. HEART AND MEDIASTINUM: No acute abnormality of the cardiac and mediastinal silhouettes. BONES AND SOFT TISSUES: No acute osseous abnormality. DIAPHRAGM: Eventration of the right hemidiaphragm. IMPRESSION: 1. Low lung volumes. Patchy bilateral lower lobe opacities, atelectasis versus pneumonia. 2. Eventration of the right hemidiaphragm. Electronically signed by: Zadie Herter MD 11/18/2023 07:23 PM EDT RP Workstation: [id number]    Microbiology: Results for orders placed or performed  during the hospital encounter of 11/18/23  Resp panel by RT-PCR (RSV, Flu A&B, Covid) Anterior Nasal Swab     Status: None   Collection Time: 11/18/23  8:38 PM   Specimen: Anterior Nasal Swab  Result Value Ref Range Status   SARS Coronavirus 2 by RT PCR NEGATIVE NEGATIVE Final    Comment: (NOTE) SARS-CoV-2 target nucleic acids are NOT DETECTED.  The SARS-CoV-2 RNA is generally detectable in upper respiratory specimens during the acute phase of infection. The lowest concentration of SARS-CoV-2 viral copies this assay can detect is 138 copies/mL. A negative result does not preclude SARS-Cov-2 infection and should not  be used as the sole basis for treatment or other patient management decisions. A negative result may occur with  improper specimen collection/handling, submission of specimen other than nasopharyngeal swab, presence of viral mutation(s) within the areas targeted by this assay, and inadequate number of viral copies(<138 copies/mL). A negative result must be combined with clinical observations, patient history, and epidemiological information. The expected result is Negative.  Fact Sheet for Patients:  BloggerCourse.com  Fact Sheet for Healthcare Providers:  SeriousBroker.it  This test is no t yet approved or cleared by the [place]  FDA and  has been authorized for detection and/or diagnosis of SARS-CoV-2 by FDA under an Emergency Use Authorization (EUA). This EUA will remain  in effect (meaning this test can be used) for the duration of the COVID-19 declaration under Section 564(b)(1) of the Act, 21 U.S.C.section 360bbb-3(b)(1), unless the authorization is terminated  or revoked sooner.       Influenza A by PCR NEGATIVE NEGATIVE Final   Influenza B by PCR NEGATIVE NEGATIVE Final    Comment: (NOTE) The Xpert Xpress SARS-CoV-2/FLU/RSV plus assay is intended as an aid in the diagnosis of influenza from  Nasopharyngeal swab specimens and should not be used as a sole basis for treatment. Nasal washings and aspirates are unacceptable for Xpert Xpress SARS-CoV-2/FLU/RSV testing.  Fact Sheet for Patients: BloggerCourse.com  Fact Sheet for Healthcare Providers: SeriousBroker.it  This test is not yet approved or cleared by the [place]  FDA and has been authorized for detection and/or diagnosis of SARS-CoV-2 by FDA under an Emergency Use Authorization (EUA). This EUA will remain in effect (meaning this test can be used) for the duration of the COVID-19 declaration under Section 564(b)(1) of the Act, 21 U.S.C. section 360bbb-3(b)(1), unless the authorization is terminated or revoked.     Resp Syncytial Virus by PCR NEGATIVE NEGATIVE Final    Comment: (NOTE) Fact Sheet for Patients: BloggerCourse.com  Fact Sheet for Healthcare Providers: SeriousBroker.it  This test is not yet approved or cleared by the [place]  FDA and has been authorized for detection and/or diagnosis of SARS-CoV-2 by FDA under an Emergency Use Authorization (EUA). This EUA will remain in effect (meaning this test can be used) for the duration of the COVID-19 declaration under Section 564(b)(1) of the Act, 21 U.S.C. section 360bbb-3(b)(1), unless the authorization is terminated or revoked.  Performed at [facility], [address] [place], [place] [address]     Labs: CBC: Recent Labs  Lab 11/18/23 1659 11/19/23 0551 11/20/23 0546  WBC 7.4 9.7 7.3  NEUTROABS 5.1  --  4.1  HGB 10.1* 11.6* 10.6*  HCT 31.0* 34.0* 32.5*  MCV 105.4* 99.7 101.9*  PLT 140* 161 158   Basic Metabolic Panel: Recent Labs  Lab 11/18/23 1659 11/19/23 0551 11/20/23 0546  NA 142 144 139  K 3.8 3.4* 3.7  CL 105 104 98  CO2 31 30 30   GLUCOSE 161* 96 103*  BUN 19 17 22   CREATININE 0.87 0.75 0.95  CALCIUM 9.2  9.5 9.1  MG 1.8  --   --    Liver Function Tests: No results for input(s): "AST", "ALT", "ALKPHOS", "BILITOT", "PROT", "ALBUMIN" in the last 168 hours. CBG: No results for input(s): "GLUCAP" in the last 168 hours.  Discharge time spent: less than 30 minutes.  Signed: Lorita Rosa, MD [facility] 11/20/2023

## 2023-11-20 NOTE — TOC Transition Note (Signed)
 Transition of Care [facility]) - Discharge Note   Patient Details  Name: Alisha Murphy MRN: [id number] Date of Birth: 04-21-1935  Transition of Care [facility]) CM/SW Contact:  Tinley Rought C Newt Levingston, RN Phone Number: 11/20/2023, 12:20 PM   Clinical Narrative:    Spoke with patient and daughter-in-law at bedside to discuss discharge plans. Patient advised the home oxygen has been requested and will be delivered to her room. They both were advised [employer] will supply her oxygen and will come to bring the remaining supplies to her home today  TOC signing off.         Patient Goals and CMS Choice            Discharge Placement                       Discharge Plan and Services Additional resources added to the After Visit Summary for                                       Social Drivers of Health (SDOH) Interventions SDOH Screenings   Food Insecurity: No Food Insecurity (11/19/2023)  Housing: Low Risk  (11/19/2023)  Transportation Needs: No Transportation Needs (11/19/2023)  Utilities: Not At Risk (11/19/2023)  Financial Resource Strain: Low Risk  (11/16/2023)   Received from [facility]  Social Connections: Moderately Integrated (11/19/2023)  Tobacco Use: Low Risk  (11/18/2023)     Readmission Risk Interventions     No data to display

## 2023-11-20 NOTE — Evaluation (Signed)
 Physical Therapy Evaluation Patient Details Name: Alisha Murphy MRN: [id number] DOB: 1934-09-28 Today's Date: 11/20/2023  History of Present Illness  89 y/o female presented to ED on 11/18/23 for SOB and fall. Admitted for possible acute CHF. PMH: HTN, COPD, hx of CVA, Afib  Clinical Impression  Patient admitted with the above. PTA, patient lives with daughter in law and son and was ambulatory with rollator and family assisted as needed for ADLs. Patient was on room air on arrival with spO2 96%. Patient able to get dressed with supervision and perform sit to stands repeatedly with supervision. Ambulated on RA with RW and supervision. SpO2 >92% throughout on RA with no reports of SOB noted. Discussed findings with daughter in law and patient. Notified RN, Consulting civil engineer, and MD about ambulatory O2 saturations. No PT follow up recommended at this time.   *of note, MD discontinued orders while PT present in room for evaluation, evaluation completed. Please re-consult with new orders if needs arise.         If plan is discharge home, recommend the following:     Can travel by private vehicle        Equipment Recommendations None recommended by PT  Recommendations for Other Services       Functional Status Assessment Patient has had a recent decline in their functional status and demonstrates the ability to make significant improvements in function in a reasonable and predictable amount of time.     Precautions / Restrictions Precautions Precautions: Fall Recall of Precautions/Restrictions: Intact Restrictions Weight Bearing Restrictions Per Provider Order: No      Mobility  Bed Mobility               General bed mobility comments: sitting in recliner    Transfers Overall transfer level: Needs assistance Equipment used: Rolling Helaina Stefano (2 wheels) Transfers: Sit to/from Stand Sit to Stand: Supervision                Ambulation/Gait Ambulation/Gait assistance:  Supervision Gait Distance (Feet): 120 Feet Assistive device: Rolling Rody Keadle (2 wheels) Gait Pattern/deviations: Step-through pattern, Decreased stride length Gait velocity: decreased     General Gait Details: ambulated on RA with spO2 >92% throughout  Stairs            Wheelchair Mobility     Tilt Bed    Modified Rankin (Stroke Patients Only)       Balance Overall balance assessment: Needs assistance, History of Falls Sitting-balance support: No upper extremity supported, Feet supported Sitting balance-Leahy Scale: Good     Standing balance support: Bilateral upper extremity supported, Reliant on assistive device for balance Standing balance-Leahy Scale: Fair                               Pertinent Vitals/Pain Pain Assessment Pain Assessment: No/denies pain    Home Living Family/patient expects to be discharged to:: Private residence Living Arrangements: Children Available Help at Discharge: Family;Available 24 hours/day Type of Home: House Home Access: Stairs to enter Entrance Stairs-Rails: Doctor, general practice of Steps: 5   Home Layout: One level Home Equipment: Rollator (4 wheels);Grab bars - toilet;Grab bars - tub/shower      Prior Function Prior Level of Function : Needs assist             Mobility Comments: ambulates with rollator ADLs Comments: daughter in law and son assists with bathing as needed     Extremity/Trunk Assessment  Upper Extremity Assessment Upper Extremity Assessment: Overall WFL for tasks assessed    Lower Extremity Assessment Lower Extremity Assessment: Overall WFL for tasks assessed    Cervical / Trunk Assessment Cervical / Trunk Assessment: Kyphotic  Communication   Communication Communication: No apparent difficulties    Cognition Arousal: Alert Behavior During Therapy: WFL for tasks assessed/performed   PT - Cognitive impairments: No apparent impairments                          Following commands: Intact       Cueing       General Comments      Exercises     Assessment/Plan    PT Assessment Patient does not need any further PT services  PT Problem List         PT Treatment Interventions      PT Goals (Current goals can be found in the Care Plan section)  Acute Rehab PT Goals Patient Stated Goal: to go home PT Goal Formulation: All assessment and education complete, DC therapy    Frequency       Co-evaluation               AM-PAC PT "6 Clicks" Mobility  Outcome Measure Help needed turning from your back to your side while in a flat bed without using bedrails?: A Little Help needed moving from lying on your back to sitting on the side of a flat bed without using bedrails?: A Little Help needed moving to and from a bed to a chair (including a wheelchair)?: A Little Help needed standing up from a chair using your arms (e.g., wheelchair or bedside chair)?: A Little Help needed to walk in hospital room?: A Little Help needed climbing 3-5 steps with a railing? : A Little 6 Click Score: 18    End of Session   Activity Tolerance: Patient tolerated treatment well Patient left: in chair;with call bell/phone within reach;with family/visitor present Nurse Communication: Mobility status PT Visit Diagnosis: Muscle weakness (generalized) (M62.81)    Time: 0865-7846 PT Time Calculation (min) (ACUTE ONLY): 38 min   Charges:   PT Evaluation $PT Eval Moderate Complexity: 1 Mod PT Treatments $Therapeutic Activity: 23-37 mins PT General Charges $$ ACUTE PT VISIT: 1 Visit         Janine Melbourne, PT, DPT Physical Therapist - [facility]  [facility]   Tyleek Smick A Jaquay Posthumus 11/20/2023, 11:55 AM

## 2023-11-23 DIAGNOSIS — I6522 Occlusion and stenosis of left carotid artery: Secondary | ICD-10-CM | POA: Diagnosis not present

## 2023-11-23 DIAGNOSIS — I10 Essential (primary) hypertension: Secondary | ICD-10-CM | POA: Diagnosis not present

## 2023-11-23 DIAGNOSIS — Z09 Encounter for follow-up examination after completed treatment for conditions other than malignant neoplasm: Secondary | ICD-10-CM | POA: Diagnosis not present

## 2023-11-23 DIAGNOSIS — I480 Paroxysmal atrial fibrillation: Secondary | ICD-10-CM | POA: Diagnosis not present

## 2023-11-23 DIAGNOSIS — E782 Mixed hyperlipidemia: Secondary | ICD-10-CM | POA: Diagnosis not present

## 2023-11-23 DIAGNOSIS — D649 Anemia, unspecified: Secondary | ICD-10-CM | POA: Diagnosis not present

## 2023-12-11 DIAGNOSIS — Z7982 Long term (current) use of aspirin: Secondary | ICD-10-CM | POA: Diagnosis not present

## 2023-12-11 DIAGNOSIS — I6522 Occlusion and stenosis of left carotid artery: Secondary | ICD-10-CM | POA: Diagnosis not present

## 2023-12-11 DIAGNOSIS — Z48812 Encounter for surgical aftercare following surgery on the circulatory system: Secondary | ICD-10-CM | POA: Diagnosis not present

## 2023-12-11 DIAGNOSIS — Z95828 Presence of other vascular implants and grafts: Secondary | ICD-10-CM | POA: Diagnosis not present

## 2023-12-11 DIAGNOSIS — K219 Gastro-esophageal reflux disease without esophagitis: Secondary | ICD-10-CM | POA: Diagnosis not present

## 2023-12-11 DIAGNOSIS — Z7902 Long term (current) use of antithrombotics/antiplatelets: Secondary | ICD-10-CM | POA: Diagnosis not present

## 2023-12-11 DIAGNOSIS — Z8673 Personal history of transient ischemic attack (TIA), and cerebral infarction without residual deficits: Secondary | ICD-10-CM | POA: Diagnosis not present

## 2023-12-21 DIAGNOSIS — J449 Chronic obstructive pulmonary disease, unspecified: Secondary | ICD-10-CM | POA: Diagnosis not present

## 2024-01-11 DIAGNOSIS — M25562 Pain in left knee: Secondary | ICD-10-CM | POA: Diagnosis not present

## 2024-01-21 DIAGNOSIS — J449 Chronic obstructive pulmonary disease, unspecified: Secondary | ICD-10-CM | POA: Diagnosis not present

## 2024-02-04 DIAGNOSIS — Z0001 Encounter for general adult medical examination with abnormal findings: Secondary | ICD-10-CM | POA: Diagnosis not present

## 2024-02-04 DIAGNOSIS — I480 Paroxysmal atrial fibrillation: Secondary | ICD-10-CM | POA: Diagnosis not present

## 2024-02-04 DIAGNOSIS — I10 Essential (primary) hypertension: Secondary | ICD-10-CM | POA: Diagnosis not present

## 2024-02-04 DIAGNOSIS — R001 Bradycardia, unspecified: Secondary | ICD-10-CM | POA: Diagnosis not present

## 2024-02-04 DIAGNOSIS — E875 Hyperkalemia: Secondary | ICD-10-CM | POA: Diagnosis not present

## 2024-02-07 DIAGNOSIS — Z96652 Presence of left artificial knee joint: Secondary | ICD-10-CM | POA: Diagnosis not present

## 2024-02-07 DIAGNOSIS — M25562 Pain in left knee: Secondary | ICD-10-CM | POA: Diagnosis not present

## 2024-02-11 DIAGNOSIS — I10 Essential (primary) hypertension: Secondary | ICD-10-CM | POA: Diagnosis not present

## 2024-02-15 DIAGNOSIS — I480 Paroxysmal atrial fibrillation: Secondary | ICD-10-CM | POA: Diagnosis not present

## 2024-02-15 DIAGNOSIS — R001 Bradycardia, unspecified: Secondary | ICD-10-CM | POA: Diagnosis not present

## 2024-02-15 DIAGNOSIS — I493 Ventricular premature depolarization: Secondary | ICD-10-CM | POA: Diagnosis not present

## 2024-02-15 DIAGNOSIS — Z96652 Presence of left artificial knee joint: Secondary | ICD-10-CM | POA: Diagnosis not present

## 2024-03-03 DIAGNOSIS — I493 Ventricular premature depolarization: Secondary | ICD-10-CM | POA: Diagnosis not present

## 2024-03-03 DIAGNOSIS — E7849 Other hyperlipidemia: Secondary | ICD-10-CM | POA: Diagnosis not present

## 2024-03-03 DIAGNOSIS — I2510 Atherosclerotic heart disease of native coronary artery without angina pectoris: Secondary | ICD-10-CM | POA: Diagnosis not present

## 2024-03-03 DIAGNOSIS — I6522 Occlusion and stenosis of left carotid artery: Secondary | ICD-10-CM | POA: Diagnosis not present

## 2024-03-11 DIAGNOSIS — M25562 Pain in left knee: Secondary | ICD-10-CM | POA: Diagnosis not present

## 2024-03-17 DIAGNOSIS — I361 Nonrheumatic tricuspid (valve) insufficiency: Secondary | ICD-10-CM | POA: Diagnosis not present

## 2024-03-17 DIAGNOSIS — I340 Nonrheumatic mitral (valve) insufficiency: Secondary | ICD-10-CM | POA: Diagnosis not present

## 2024-03-17 DIAGNOSIS — I517 Cardiomegaly: Secondary | ICD-10-CM | POA: Diagnosis not present

## 2024-03-17 DIAGNOSIS — I358 Other nonrheumatic aortic valve disorders: Secondary | ICD-10-CM | POA: Diagnosis not present

## 2024-03-19 DIAGNOSIS — H26493 Other secondary cataract, bilateral: Secondary | ICD-10-CM | POA: Diagnosis not present

## 2024-03-19 DIAGNOSIS — H353131 Nonexudative age-related macular degeneration, bilateral, early dry stage: Secondary | ICD-10-CM | POA: Diagnosis not present
